# Patient Record
Sex: Male | Born: 1970 | Race: White | Hispanic: Refuse to answer | Marital: Single | Smoking: Former smoker
Health system: Southern US, Community
[De-identification: ages and names within clinical notes are randomized; demographics above are authoritative.]

## PROBLEM LIST (undated history)

## (undated) DIAGNOSIS — R768 Other specified abnormal immunological findings in serum: Secondary | ICD-10-CM

## (undated) DIAGNOSIS — N35919 Unspecified urethral stricture, male, unspecified site: Secondary | ICD-10-CM

## (undated) DIAGNOSIS — F411 Generalized anxiety disorder: Principal | ICD-10-CM

## (undated) DIAGNOSIS — I10 Essential (primary) hypertension: Secondary | ICD-10-CM

## (undated) DIAGNOSIS — N442 Benign cyst of testis: Secondary | ICD-10-CM

## (undated) DIAGNOSIS — M869 Osteomyelitis, unspecified: Secondary | ICD-10-CM

## (undated) HISTORY — DX: Other specified abnormal immunological findings in serum: R76.8

## (undated) HISTORY — DX: Benign cyst of testis: N44.2

## (undated) HISTORY — DX: Osteomyelitis, unspecified: M86.9

## (undated) HISTORY — DX: Generalized anxiety disorder: F41.1

## (undated) HISTORY — DX: Essential (primary) hypertension: I10

## (undated) HISTORY — DX: Unspecified urethral stricture, male, unspecified site: N35.919

---

## 2010-01-14 ENCOUNTER — Emergency Department (HOSPITAL_BASED_OUTPATIENT_CLINIC_OR_DEPARTMENT_OTHER): Admission: EM | Admit: 2010-01-14 | Discharge: 2010-01-14 | Payer: Self-pay | Admitting: Emergency Medicine

## 2010-01-16 ENCOUNTER — Emergency Department (HOSPITAL_BASED_OUTPATIENT_CLINIC_OR_DEPARTMENT_OTHER): Admission: EM | Admit: 2010-01-16 | Discharge: 2010-01-16 | Payer: Self-pay | Admitting: Emergency Medicine

## 2013-01-03 ENCOUNTER — Encounter: Payer: Self-pay | Admitting: Family Medicine

## 2013-01-03 ENCOUNTER — Ambulatory Visit (INDEPENDENT_AMBULATORY_CARE_PROVIDER_SITE_OTHER): Payer: BC Managed Care – PPO | Admitting: Family Medicine

## 2013-01-03 VITALS — BP 134/82 | HR 63 | Ht 77.5 in | Wt 335.0 lb

## 2013-01-03 DIAGNOSIS — Z13220 Encounter for screening for lipoid disorders: Secondary | ICD-10-CM

## 2013-01-03 DIAGNOSIS — N508 Other specified disorders of male genital organs: Secondary | ICD-10-CM

## 2013-01-03 DIAGNOSIS — G5700 Lesion of sciatic nerve, unspecified lower limb: Secondary | ICD-10-CM

## 2013-01-03 DIAGNOSIS — B353 Tinea pedis: Secondary | ICD-10-CM

## 2013-01-03 DIAGNOSIS — G5701 Lesion of sciatic nerve, right lower limb: Secondary | ICD-10-CM

## 2013-01-03 DIAGNOSIS — F411 Generalized anxiety disorder: Secondary | ICD-10-CM | POA: Insufficient documentation

## 2013-01-03 DIAGNOSIS — Z131 Encounter for screening for diabetes mellitus: Secondary | ICD-10-CM

## 2013-01-03 DIAGNOSIS — N442 Benign cyst of testis: Secondary | ICD-10-CM | POA: Insufficient documentation

## 2013-01-03 HISTORY — DX: Benign cyst of testis: N44.2

## 2013-01-03 HISTORY — DX: Generalized anxiety disorder: F41.1

## 2013-01-03 MED ORDER — CLOTRIMAZOLE-BETAMETHASONE 1-0.05 % EX CREA: TOPICAL_CREAM | CUTANEOUS | Status: AC

## 2013-01-03 NOTE — Progress Notes (Signed)
CC: Donovyn Guidice Matzke is a 42 y.o. male is here for Establish Care   Subjective: HPI:  Pleasant 42 year old here to establish care  Complains of left foot discomfort described as somewhat numb and itching in a sock distribution. Mild severity present for months, improves with antifungal/antibacterial/antiviral soap. Nothing particularly makes it worse. Associated redness and flaking of the skin. Denies motor or sensory disturbances in the appendage other than above, denies weakness or joint pain in that foot  Patient complains of right buttock pain is described as a mild burning slight radiation down back of leg. Worse with standing for long periods of time. Improves with movement and stretching. Has been present for a year. Occurs less thanof the week. Significant improvement with ibuprofen. Nothing else makes better or worse. Denies skin changes at the site of discomfort, weakness of the right leg nor sensory disturbances otherwise  Patient reports history of generalized anxiety disorder he is taking Zoloft for over a year with significant improvement. Symptoms are nonexistent while on Zoloft, while off will have panic attacks. Denies depression, mental disturbance otherwise, paranoia, alcohol abuse  Review of Systems - General ROS: negative for - chills, fever, night sweats, weight gain or weight loss Ophthalmic ROS: negative for - decreased vision Psychological ROS: negative for - anxiety or depression ENT ROS: negative for - hearing change, nasal congestion, tinnitus or allergies Hematological and Lymphatic ROS: negative for - bleeding problems, bruising or swollen lymph nodes Breast ROS: negative Respiratory ROS: no cough, shortness of breath, or wheezing Cardiovascular ROS: no chest pain or dyspnea on exertion Gastrointestinal ROS: no abdominal pain, change in bowel habits, or black or bloody stools Genito-Urinary ROS: negative for - genital discharge, genital ulcers, incontinence or  abnormal bleeding from genitals Musculoskeletal ROS: negative for - joint pain or muscle pain Neurological ROS: negative for - headaches or memory loss Dermatological ROS: negative for lumps, mole changes, rash and skin lesion changes  Past Medical History  Diagnosis Date  . Testicular cyst 01/03/2013  . Generalized anxiety disorder 01/03/2013     Family History  Problem Relation Age of Onset  . Adopted: Yes  . Family history unknown: Yes     History  Substance Use Topics  . Smoking status: Former Smoker    Types: Cigarettes    Quit date: 08/16/2012  . Smokeless tobacco: Not on file  . Alcohol Use: 0.5 oz/week    1 drink(s) per week     Objective: Filed Vitals:   01/03/13 1318  BP: 134/82  Pulse: 63    General: Alert and Oriented, No Acute Distress HEENT: Pupils equal, round, reactive to light. Conjunctivae clear.  Moist mucous membranes Lungs: Clear to auscultation bilaterally, no wheezing/ronchi/rales.  Comfortable work of breathing. Good air movement. Cardiac: Regular rate and rhythm. Normal S1/S2.  No murmurs, rubs, nor gallops.   Abdomen: Obese soft nontender Extremities: No peripheral edema.  Strong peripheral pulses.  Diabetic Foot Exam: Dorsalis pedis pulses 1+ bilaterally.  Monofilament sensation intact on plantar and dorsal surface bilaterally.  On the left foot there is mild to moderate erythema with hyperkeratotic skin on the heel reaching to plantar surface near the toes with excoriations . Mental Status: No depression, anxiety, nor agitation. Skin: Warm and dry.  Assessment & Plan: Dawn was seen today for establish care.  Diagnoses and associated orders for this visit:  Generalized anxiety disorder  Lipid screening - Lipid panel  Diabetes mellitus screening - BASIC METABOLIC PANEL WITH GFR  Testicular cyst  Tinea pedis - clotrimazole-betamethasone (LOTRISONE) cream; Apply to affected area twice a day for two weeks, may require four weeks if  involving the feet/toes.  Piriformis syndrome, right    Patient is due for routine screening of dyslipidemia, type 2 diabetes. Testicular cyst is being managed by his urologist  Discuss sensory symptoms in left foot is likely due to tinea pedis therefore start Lotrisone for the next 2 weeks-4 weeks Right buttock pain likely due to piriformis syndrome, handout given with stretches and demonstrated such stretches for him to do a daily basis for the next 2 weeks Generalized anxiety disorder: Controlled, continue Zoloft  Return in about 4 weeks (around 01/31/2013) for cpe.

## 2013-01-04 ENCOUNTER — Telehealth: Payer: Self-pay | Admitting: Family Medicine

## 2013-01-04 DIAGNOSIS — E785 Hyperlipidemia, unspecified: Secondary | ICD-10-CM | POA: Insufficient documentation

## 2013-01-04 DIAGNOSIS — E781 Pure hyperglyceridemia: Secondary | ICD-10-CM | POA: Insufficient documentation

## 2013-01-04 LAB — BASIC METABOLIC PANEL WITH GFR
GFR, Est African American: >89 mL/min
GFR, Est Non African American: >89 mL/min
Potassium: 4.3 mEq/L (ref 3.5–5.3)
Sodium: 139 mEq/L (ref 135–145)

## 2013-01-04 LAB — LIPID PANEL
LDL Cholesterol: 152 mg/dL — ABNORMAL HIGH (ref 0–99)
Total CHOL/HDL Ratio: 7.9 Ratio
VLDL: 61 mg/dL — ABNORMAL HIGH (ref 0–40)

## 2013-01-04 MED ORDER — FENOFIBRATE 48 MG PO TABS: 48.0000 mg | ORAL_TABLET | Freq: Every day | ORAL | Status: DC

## 2013-01-04 NOTE — Telephone Encounter (Signed)
Sue Lush, Will you please let Mr. Corey Mayo know that his LDL cholesterol was at goal however his triglycerides were elevated to a degree that can cause pancreatic and liver inflammation over time.  I'd encourage him to consider starting a Rx medication to help lower this.  I've sent an Rx of tricor[facility]ibrate [facility]  We can check the effectiveness by repeating a cholesterol panel in three months. Triglycerides will improve with weight loss so he's already on the right track.

## 2013-01-04 NOTE — Telephone Encounter (Signed)
Left detailed message on vm with results

## 2013-01-06 ENCOUNTER — Encounter: Payer: Self-pay | Admitting: Family Medicine

## 2013-01-11 ENCOUNTER — Ambulatory Visit (INDEPENDENT_AMBULATORY_CARE_PROVIDER_SITE_OTHER): Payer: BC Managed Care – PPO | Admitting: Family Medicine

## 2013-01-11 ENCOUNTER — Encounter: Payer: Self-pay | Admitting: Family Medicine

## 2013-01-11 VITALS — BP 127/77 | HR 83 | Temp 98.4°F | Resp 18 | Ht 77.5 in | Wt 340.0 lb

## 2013-01-11 DIAGNOSIS — Z0000 Encounter for general adult medical examination without abnormal findings: Secondary | ICD-10-CM

## 2013-01-11 NOTE — Patient Instructions (Addendum)
Dr. Martin Belling's General Advice Following Your Complete Physical Exam  The Benefits of Regular Exercise: Unless you suffer from an uncontrolled cardiovascular condition, studies strongly suggest that regular exercise and physical activity will add to both the quality and length of your life.  The World Health Organization recommends 150 minutes of moderate intensity aerobic activity every week.  This is best split over 3-4 days a week, and can be as simple as a brisk walk for just over 35 minutes "most days of the week".  This type of exercise has been shown to lower LDL-Cholesterol, lower average blood sugars, lower blood pressure, lower cardiovascular disease risk, improve memory, and increase one's overall sense of wellbeing.  The addition of anaerobic (or "strength training") exercises offers additional benefits including but not limited to increased metabolism, prevention of osteoporosis, and improved overall cholesterol levels.  How Can I Strive For A Low-Fat Diet?: Current guidelines recommend that 25-35 percent of your daily energy (food) intake should come from fats.  One might ask how can this be achieved without having to dissect each meal on a daily basis?  Switch to skim or 1% milk instead of whole milk.  Focus on lean meats such as ground turkey, fresh fish, baked chicken, and lean cuts of beef as your source of dietary protein.  Consume less than 300mg/day of dietary cholesterol.  Limit trans fatty acid consumption primarily by limiting synthetic trans fats such as partially hydrogenated oils (Ex: fried fast foods).  Focus efforts on reducing your intake of "solid" fats (Ex: Butter).  Substitute olive or vegetable oil for solid fats where possible.  Moderation of Salt Intake: Provided you don't carry a diagnosis of congestive heart failure nor renal failure, I recommend a daily allowance of no more than 2300 mg of salt (sodium).  Keeping under this daily goal is associated with a  decreased risk of cardiovascular events, creeping above it can lead to elevated blood pressures and increases your risk of cardiovascular events.  Milligrams (mg) of salt is listed on all nutrition labels, and your daily intake can add up faster than you think.  Most canned and frozen dinners can pack in over half your daily salt allowance in one meal.    Lifestyle Health Risks: Certain lifestyle choices carry specific health risks.  As you may already know, tobacco use has been associated with increasing one's risk of cardiovascular disease, pulmonary disease, numerous cancers, among many other issues.  What you may not know is that there are medications and nicotine replacement strategies that can more than double your chances of successfully quitting.  I would be thrilled to help manage your quitting strategy if you currently use tobacco products.  When it comes to alcohol use, I've yet to find an "ideal" daily allowance.  Provided an individual does not have a medical condition that is exacerbated by alcohol consumption, general guidelines determine "safe drinking" as no more than two standard drinks for a man or no more than one standard drink for a male per day.  However, much debate still exists on whether any amount of alcohol consumption is technically "safe".  My general advice, keep alcohol consumption to a minimum for general health promotion.  If you or others believe that alcohol, tobacco, or recreational drug use is interfering with your life, I would be happy to provide confidential counseling regarding treatment options.  General "Over The Counter" Nutrition Advice: Postmenopausal women should aim for a daily calcium intake of 1200 mg, however a significant   portion of this might already be provided by diets including milk, yogurt, cheese, and other dairy products.  Vitamin D has been shown to help preserve bone density, prevent fatigue, and has even been shown to help reduce falls in the  elderly.  Ensuring a daily intake of 800 Units of Vitamin D is a good place to start to enjoy the above benefits, we can easily check your Vitamin D level to see if you'd potentially benefit from supplementation beyond 800 Units a day.  Folic Acid intake should be of particular concern to women of childbearing age.  Daily consumption of 400-800 mcg of Folic Acid is recommended to minimize the chance of spinal cord defects in a fetus should pregnancy occur.    For many adults, accidents still remain one of the most common culprits when it comes to cause of death.  Some of the simplest but most effective preventitive habits you can adopt include regular seatbelt use, proper helmet use, securing firearms, and regularly testing your smoke and carbon monoxide detectors.  Nicolina Hirt B. Megen Madewell DO [facility] [address], [address] [place], [place] [address] Phone: [phone]  

## 2013-01-11 NOTE — Progress Notes (Signed)
CC: Corey Mayo is a 42 y.o. male is here for Annual Exam   Subjective: HPI:  Colonoscopy: No family hx colon cancer, no history of rectal bleeding. Prostate: Discussed screening risks/beneifts with patient on 01/11/2013.    Influenza Vaccine: out of season Pneumovax: no indication, no longer a smoker Td/Tdap: received in 2010 Zoster: Not indicated  Over the past 2 weeks have you been bothered by: - Little interest or pleasure in doing things: no - Feeling down depressed or hopeless: no  No longer smoking tobacco, last use January 2014. Drinks alcohol in moderation, occasional marijuana use no other substance abuse  He has already started making big changes with lowering his saturated fat and overall that fat or consumption, reports his diet was predominantly fast food prior to cholesterol panel earlier this month. He is also trying to increase physical activity on a daily basis  Review of Systems - General ROS: negative for - chills, fever, night sweats, weight gain or weight loss Ophthalmic ROS: negative for - decreased vision Psychological ROS: negative for - anxiety or depression ENT ROS: negative for - hearing change, nasal congestion, tinnitus or allergies Hematological and Lymphatic ROS: negative for - bleeding problems, bruising or swollen lymph nodes Breast ROS: negative Respiratory ROS: no cough, shortness of breath, or wheezing Cardiovascular ROS: no chest pain or dyspnea on exertion Gastrointestinal ROS: no abdominal pain, change in bowel habits, or black or bloody stools Genito-Urinary ROS: negative for - genital discharge, genital ulcers, incontinence or abnormal bleeding from genitals Musculoskeletal ROS: negative for - joint pain or muscle pain Neurological ROS: negative for - headaches or memory loss Dermatological ROS: negative for lumps, mole changes, rash and skin lesion changes   Past Medical History  Diagnosis Date  . Testicular cyst 01/03/2013  .  Generalized anxiety disorder 01/03/2013     Family History  Problem Relation Age of Onset  . Adopted: Yes  . Family history unknown: Yes     History  Substance Use Topics  . Smoking status: Former Smoker    Types: Cigarettes    Quit date: 08/16/2012  . Smokeless tobacco: Not on file  . Alcohol Use: 0.5 oz/week    1 drink(s) per week     Objective: Filed Vitals:   01/11/13 1312  BP: 127/77  Pulse: 83  Temp: 98.4 F (36.9 C)  Resp: 18    General: No Acute Distress HEENT: Atraumatic, normocephalic, conjunctivae normal without scleral icterus.  No nasal discharge, hearing grossly intact, TMs with good landmarks bilaterally with no middle ear abnormalities, posterior pharynx clear without oral lesions. Neck: Supple, trachea midline, no cervical nor supraclavicular adenopathy. Pulmonary: Clear to auscultation bilaterally without wheezing, rhonchi, nor rales. Cardiac: Regular rate and rhythm.  No murmurs, rubs, nor gallops. No peripheral edema.  2+ peripheral pulses bilaterally. Abdomen: Bowel sounds normal.  No masses.  Non-tender without rebound.  Negative Murphy's sign. GU:   Bilateral descended non-tender testicles  without inguinal hernia MSK: Grossly intact, no signs of weakness.  Full strength throughout upper and lower extremities.  Full ROM in upper and lower extremities.  No midline spinal tenderness. Neuro: Gait unremarkable, CN II-XII grossly intact.  C5-C6 Reflex 2/4 Bilaterally, L4 Reflex 2/4 Bilaterally.  Cerebellar function intact. Skin: No rashes. Numerous skin tags under both axilla, seborrheic keratosis on left dorsal wrist, no suspicious lesions. Hidradenitis suppurativa scarring under both left axilla without active disease Psych: Alert and oriented to person/place/time.  Thought process normal. No anxiety/depression.  Assessment &  Plan: Corey Mayo was seen today for annual exam.  Diagnoses and associated orders for this visit:  Physical exam,  annual    Healthy lifestyle interventions including but limited to regular exercise, a healthy low fat diet, moderation of salt intake, the dangers of tobacco/alcohol/recreational drug use, nutrition supplementation, and accident avoidance were discussed with the patient and a handout was provided for future reference.  We spent a good deal of time discussing dietary interventions to help lower his cholesterol and triglycerides. Discussed benefits of exercise, he is going to consider adding over-the-counter once - twice a day fish oil supplement  Return in about 3 months (around 04/13/2013).

## 2013-04-18 ENCOUNTER — Ambulatory Visit (INDEPENDENT_AMBULATORY_CARE_PROVIDER_SITE_OTHER): Payer: BC Managed Care – PPO | Admitting: Family Medicine

## 2013-04-18 ENCOUNTER — Encounter: Payer: Self-pay | Admitting: Family Medicine

## 2013-04-18 VITALS — BP 124/74 | HR 62 | Wt 302.0 lb

## 2013-04-18 DIAGNOSIS — F411 Generalized anxiety disorder: Secondary | ICD-10-CM

## 2013-04-18 DIAGNOSIS — N442 Benign cyst of testis: Secondary | ICD-10-CM

## 2013-04-18 DIAGNOSIS — N508 Other specified disorders of male genital organs: Secondary | ICD-10-CM

## 2013-04-18 DIAGNOSIS — E781 Pure hyperglyceridemia: Secondary | ICD-10-CM

## 2013-04-18 MED ORDER — SERTRALINE HCL 100 MG PO TABS: 100.0000 mg | ORAL_TABLET | Freq: Every day | ORAL | Status: DC

## 2013-04-18 NOTE — Progress Notes (Signed)
CC: Corey Mayo is a 42 y.o. male is here for f/u cholesterol   Subjective: HPI:  Followup hypertriglyceridemia: 3 months ago triglycerides were found to be 300 with moderate elevation of LDL. Patient has significantly cut out simple sugars in his diet now eating more complex carbohydrates and lean meats. He is trying to stay more active but does not have a formal exercise routine. He has been able to lose over 35 pounds over the summer and is feeling great. Denies right upper quadrant pain skin changes chest pain or limb claudication.  Generalized anxiety disorder: He has been on Zoloft for the past 10 years and states he's feeling that this is considerably helped him as he no longer has any anxiety. 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 He would like to know if I be able to now take over the formal prescription. Denies thoughts wanting to harm himself or others   Review Of Systems Outlined In HPI  Past Medical History  Diagnosis Date  . Testicular cyst 01/03/2013  . Generalized anxiety disorder 01/03/2013     Family History  Problem Relation Age of Onset  . Adopted: Yes     History  Substance Use Topics  . Smoking status: Former Smoker    Types: Cigarettes    Quit date: 08/16/2012  . Smokeless tobacco: Not on file  . Alcohol Use: 0.5 oz/week    1 drink(s) per week     Objective: Filed Vitals:   04/18/13 1312  BP: 124/74  Pulse: 62    Vital signs reviewed. General: Alert and Oriented, No Acute Distress HEENT: Pupils equal, round, reactive to light. Conjunctivae clear.  External ears unremarkable.  Moist mucous membranes. Lungs: Clear and comfortable work of breathing, speaking in full sentences without accessory muscle use. Cardiac: Regular rate and rhythm.  Neuro: CN II-XII grossly intact, gait normal. Extremities: No peripheral edema.  Strong peripheral pulses.  Mental Status: No depression, anxiety, nor agitation.  Logical though process. Skin: Warm and dry.  Assessment & Plan: Cardin was seen today for f/u cholesterol.  Diagnoses and associated orders for this visit:  Hypertriglyceridemia - Lipid panel  Testicular cyst  Generalized anxiety disorder  Other Orders - sertraline (ZOLOFT) 100 MG tablet; Take 1 tablet (100 mg total) by mouth daily.    Hypertriglyceridemia: Repeat fasting lipid panel, congratulated him on drastic weight loss, he would really like to avoid medication if possible Generalized anxiety disorder: Stable continue Zoloft   Return if symptoms worsen or fail to improve.

## 2013-04-19 LAB — LIPID PANEL
Cholesterol: 170 mg/dL (ref 0–200)
HDL: 34 mg/dL — ABNORMAL LOW (ref >39)
Triglycerides: 105 mg/dL (ref <150)

## 2013-05-26 ENCOUNTER — Other Ambulatory Visit: Payer: Self-pay

## 2014-01-23 ENCOUNTER — Encounter: Payer: Self-pay | Admitting: Family Medicine

## 2014-01-23 ENCOUNTER — Ambulatory Visit (INDEPENDENT_AMBULATORY_CARE_PROVIDER_SITE_OTHER): Payer: BC Managed Care – PPO | Admitting: Family Medicine

## 2014-01-23 VITALS — BP 131/83 | HR 55 | Ht 77.5 in | Wt 275.0 lb

## 2014-01-23 DIAGNOSIS — B353 Tinea pedis: Secondary | ICD-10-CM

## 2014-01-23 DIAGNOSIS — Z131 Encounter for screening for diabetes mellitus: Secondary | ICD-10-CM

## 2014-01-23 DIAGNOSIS — E785 Hyperlipidemia, unspecified: Secondary | ICD-10-CM

## 2014-01-23 DIAGNOSIS — Z0000 Encounter for general adult medical examination without abnormal findings: Secondary | ICD-10-CM

## 2014-01-23 DIAGNOSIS — E781 Pure hyperglyceridemia: Secondary | ICD-10-CM

## 2014-01-23 DIAGNOSIS — B351 Tinea unguium: Secondary | ICD-10-CM

## 2014-01-23 MED ORDER — TERBINAFINE HCL 250 MG PO TABS: 250.0000 mg | ORAL_TABLET | Freq: Every day | ORAL | Status: DC

## 2014-01-23 MED ORDER — CLOTRIMAZOLE-BETAMETHASONE 1-0.05 % EX CREA: TOPICAL_CREAM | CUTANEOUS | Status: AC

## 2014-01-23 NOTE — Patient Instructions (Signed)
Dr. Zaquan Duffner's General Advice Following Your Complete Physical Exam  The Benefits of Regular Exercise: Unless you suffer from an uncontrolled cardiovascular condition, studies strongly suggest that regular exercise and physical activity will add to both the quality and length of your life.  The World Health Organization recommends 150 minutes of moderate intensity aerobic activity every week.  This is best split over 3-4 days a week, and can be as simple as a brisk walk for just over 35 minutes "most days of the week".  This type of exercise has been shown to lower LDL-Cholesterol, lower average blood sugars, lower blood pressure, lower cardiovascular disease risk, improve memory, and increase one's overall sense of wellbeing.  The addition of anaerobic (or "strength training") exercises offers additional benefits including but not limited to increased metabolism, prevention of osteoporosis, and improved overall cholesterol levels.  How Can I Strive For A Low-Fat Diet?: Current guidelines recommend that 25-35 percent of your daily energy (food) intake should come from fats.  One might ask how can this be achieved without having to dissect each meal on a daily basis?  Switch to skim or 1% milk instead of whole milk.  Focus on lean meats such as ground turkey, fresh fish, baked chicken, and lean cuts of beef as your source of dietary protein.  Limit saturated fat consumption to less than 10% of your daily caloric intake.  Limit trans fatty acid consumption primarily by limiting synthetic trans fats such as partially hydrogenated oils (Ex: fried fast foods).  Substitute olive or vegetable oil for solid fats where possible.  Moderation of Salt Intake: Provided you don't carry a diagnosis of congestive heart failure nor renal failure, I recommend a daily allowance of no more than 2300 mg of salt (sodium).  Keeping under this daily goal is associated with a decreased risk of cardiovascular events, creeping  above it can lead to elevated blood pressures and increases your risk of cardiovascular events.  Milligrams (mg) of salt is listed on all nutrition labels, and your daily intake can add up faster than you think.  Most canned and frozen dinners can pack in over half your daily salt allowance in one meal.    Lifestyle Health Risks: Certain lifestyle choices carry specific health risks.  As you may already know, tobacco use has been associated with increasing one's risk of cardiovascular disease, pulmonary disease, numerous cancers, among many other issues.  What you may not know is that there are medications and nicotine replacement strategies that can more than double your chances of successfully quitting.  I would be thrilled to help manage your quitting strategy if you currently use tobacco products.  When it comes to alcohol use, I've yet to find an "ideal" daily allowance.  Provided an individual does not have a medical condition that is exacerbated by alcohol consumption, general guidelines determine "safe drinking" as no more than two standard drinks for a man or no more than one standard drink for a male per day.  However, much debate still exists on whether any amount of alcohol consumption is technically "safe".  My general advice, keep alcohol consumption to a minimum for general health promotion.  If you or others believe that alcohol, tobacco, or recreational drug use is interfering with your life, I would be happy to provide confidential counseling regarding treatment options.  General "Over The Counter" Nutrition Advice: Postmenopausal women should aim for a daily calcium intake of 1200 mg, however a significant portion of this might already be   provided by diets including milk, yogurt, cheese, and other dairy products.  Vitamin D has been shown to help preserve bone density, prevent fatigue, and has even been shown to help reduce falls in the elderly.  Ensuring a daily intake of 800 Units of  Vitamin D is a good place to start to enjoy the above benefits, we can easily check your Vitamin D level to see if you'd potentially benefit from supplementation beyond 800 Units a day.  Folic Acid intake should be of particular concern to women of childbearing age.  Daily consumption of 400-800 mcg of Folic Acid is recommended to minimize the chance of spinal cord defects in a fetus should pregnancy occur.    For many adults, accidents still remain one of the most common culprits when it comes to cause of death.  Some of the simplest but most effective preventitive habits you can adopt include regular seatbelt use, proper helmet use, securing firearms, and regularly testing your smoke and carbon monoxide detectors.  Darthula Desa B. Marsia Cino DO [facility] [address], [address] [place], [place] [address] Phone: [phone]  Testicular Self-Exam A self-examination of your testicles involves looking at and feeling your testicles for abnormal lumps or swelling. Several things can cause swelling, lumps, or pain in your testicles. Some of these causes are:  Injuries.  Inflammation.  Infection.  Accumulation of fluids around your testicle (hydrocele).  Twisted testicles (testicular torsion).  Testicular cancer. Self-examination of the testicles and groin areas may be advised if you are at risk for testicular cancer. Risks for testicular cancer include:  An undescended testicle (cryptorchidism).  A history of previous testicular cancer.  A family history of testicular cancer. The testicles are easiest to examine after warm baths or showers and are more difficult to examine when you are cold. This is because the muscles attached to the testicles retract and pull them up higher or into the abdomen. Follow these steps while you are standing:  Hold your penis away from your body.  Roll one testicle between your thumb and forefinger, feeling the entire testicle.  Roll the other  testicle between your thumb and forefinger, feeling the entire testicle. Feel for lumps, swelling, or discomfort. A normal testicle is egg shaped and feels firm. It is smooth and not tender. The spermatic cord can be felt as a firm spaghetti-like cord at the back of your testicle. It is also important to examine the crease between the front of your leg and your abdomen. Feel for any bumps that are tender. These could be enlarged lymph nodes.  Document Released: 10/13/2000 Document Revised: 03/09/2013 Document Reviewed: 12/27/2012 [employer] Patient Information 2015 [employer]. This information is not intended to replace advice given to you by your health care provider. Make sure you discuss any questions you have with your health care provider.  

## 2014-01-23 NOTE — Progress Notes (Signed)
CC: Corey Mayo is a 43 y.o. male is here for Annual Exam   Subjective: HPI:  Colonoscopy: No family history of colon cancer will begin screening at age 43 Prostate: Discussed screening risks/beneifts with patient during today's visit, no family history of prostate cancer we'll consider screening start at age 43  Influenza Vaccine: No current indication but I've recommended that he get this during flu season Pneumovax: No current indication Td/Tdap: Tdap 2010 up-to-date Zoster: (Start 43 yo)  No tobacco, alcohol or recreational drug use.  His only complaint is discoloration of the feet and toenails.  He continues to exercise most days of the week with a bicycle or stationary trainer.  Review of Systems - General ROS: negative for - chills, fever, night sweats, weight gain or weight loss Ophthalmic ROS: negative for - decreased vision Psychological ROS: negative for - anxiety or depression ENT ROS: negative for - hearing change, nasal congestion, tinnitus or allergies Hematological and Lymphatic ROS: negative for - bleeding problems, bruising or swollen lymph nodes Breast ROS: negative Respiratory ROS: no cough, shortness of breath, or wheezing Cardiovascular ROS: no chest pain or dyspnea on exertion Gastrointestinal ROS: no abdominal pain, change in bowel habits, or black or bloody stools Genito-Urinary ROS: negative for - genital discharge, genital ulcers, incontinence or abnormal bleeding from genitals Musculoskeletal ROS: negative for - joint pain or muscle pain Neurological ROS: negative for - headaches or memory loss Dermatological ROS: negative for lumps, mole changes, rash and skin lesion changes other than flaking of both feet and no discoloration of the nails  Past Medical History  Diagnosis Date  . Testicular cyst 01/03/2013  . Generalized anxiety disorder 01/03/2013    No past surgical history on file. Family History  Problem Relation Age of Onset  . Adopted:  Yes    History   Social History  . Marital Status: Married    Spouse Name: N/A    Number of Children: N/A  . Years of Education: N/A   Occupational History  . Not on file.   Social History Main Topics  . Smoking status: Former Smoker    Types: Cigarettes    Quit date: 08/16/2012  . Smokeless tobacco: Not on file  . Alcohol Use: 0.5 oz/week    1 drink(s) per week  . Drug Use: Yes  . Sexual Activity: Yes   Other Topics Concern  . Not on file   Social History Narrative  . No narrative on file     Objective: BP 131/83  Pulse 55  Ht 6' 5.5" (1.969 m)  Wt 275 lb (124.739 kg)  BMI 32.17 kg/m2  General: No Acute Distress HEENT: Atraumatic, normocephalic, conjunctivae normal without scleral icterus.  No nasal discharge, hearing grossly intact, TMs with good landmarks bilaterally with no middle ear abnormalities, posterior pharynx clear without oral lesions. Neck: Supple, trachea midline, no cervical nor supraclavicular adenopathy. Pulmonary: Clear to auscultation bilaterally without wheezing, rhonchi, nor rales. Cardiac: Regular rate and rhythm.  No murmurs, rubs, nor gallops. No peripheral edema.  2+ peripheral pulses bilaterally. Abdomen: Bowel sounds normal.  No masses.  Non-tender without rebound.  Negative Murphy's sign. GU: Bilateral descended testes without inguinal hernia MSK: Grossly intact, no signs of weakness.  Full strength throughout upper and lower extremities.  Full ROM in upper and lower extremities.  No midline spinal tenderness. Neuro: Gait unremarkable, CN II-XII grossly intact.  C5-C6 Reflex 2/4 Bilaterally, L4 Reflex 2/4 Bilaterally.  Cerebellar function intact. Skin: No rashes. Flaking and  cracking of both bilateral feet most predominant in the heels with mild erythema and yellowing and flaking of all 10 toenails Psych: Alert and oriented to person/place/time.  Thought process normal. No anxiety/depression.  Assessment & Plan: Corey Mayo was seen today for  annual exam.  Diagnoses and associated orders for this visit:  Annual physical exam  Hypertriglyceridemia - Lipid panel - COMPLETE METABOLIC PANEL WITH GFR  Hyperlipidemia LDL goal <160 - Lipid panel  Diabetes mellitus screening - COMPLETE METABOLIC PANEL WITH GFR  Nail fungus - terbinafine (LAMISIL) 250 MG tablet; Take 1 tablet (250 mg total) by mouth daily.  Tinea pedis of both feet - clotrimazole-betamethasone (LOTRISONE) cream; Apply to affected area twice a day for two weeks, may require four weeks if involving the feet/toes.    Onychomycosis: Start terbinafine, checking liver enzymes today, recheck liver enzymes as well as lab only visit in one month. Healthy lifestyle interventions including but not limited to regular exercise, a healthy low fat diet, moderation of salt intake, the dangers of tobacco/alcohol/recreational drug use, nutrition supplementation, and accident avoidance were discussed with the patient and a handout was provided for future reference.    Return in about 1 year (around 01/24/2015) for annual physical.

## 2014-01-24 LAB — COMPLETE METABOLIC PANEL WITH GFR
ALT: 12 U/L (ref 0–53)
AST: 14 U/L (ref 0–37)
Albumin: 3.9 g/dL (ref 3.5–5.2)
Alkaline Phosphatase: 64 U/L (ref 39–117)
BUN: 8 mg/dL (ref 6–23)
CALCIUM: 8.8 mg/dL (ref 8.4–10.5)
CHLORIDE: 108 meq/L (ref 96–112)
CO2: 24 meq/L (ref 19–32)
CREATININE: 0.66 mg/dL (ref 0.50–1.35)
GLUCOSE: 79 mg/dL (ref 70–99)
Potassium: 4.3 mEq/L (ref 3.5–5.3)
Sodium: 141 mEq/L (ref 135–145)
Total Bilirubin: 0.6 mg/dL (ref 0.2–1.2)
Total Protein: 6.4 g/dL (ref 6.0–8.3)

## 2014-01-24 LAB — LIPID PANEL
CHOLESTEROL: 170 mg/dL (ref 0–200)
HDL: 41 mg/dL (ref >39)
LDL CALC: 101 mg/dL — AB (ref 0–99)
TRIGLYCERIDES: 142 mg/dL (ref <150)
Total CHOL/HDL Ratio: 4.1 Ratio
VLDL: 28 mg/dL (ref 0–40)

## 2014-04-21 ENCOUNTER — Telehealth: Payer: Self-pay | Admitting: *Deleted

## 2014-04-21 ENCOUNTER — Other Ambulatory Visit: Payer: Self-pay | Admitting: Family Medicine

## 2014-04-21 NOTE — Telephone Encounter (Signed)
Pt called requesting refill on sertraline. It was already sent to the pharm but pt does need a f/u since it has been a year since he was last seen specifically for anxiety. Pt notified and he will schedule an appt

## 2016-02-06 ENCOUNTER — Encounter: Payer: Self-pay | Admitting: Family Medicine

## 2016-02-06 ENCOUNTER — Ambulatory Visit (INDEPENDENT_AMBULATORY_CARE_PROVIDER_SITE_OTHER): Payer: BLUE CROSS/BLUE SHIELD | Admitting: Family Medicine

## 2016-02-06 VITALS — BP 135/82 | HR 61 | Wt 239.0 lb

## 2016-02-06 DIAGNOSIS — Z202 Contact with and (suspected) exposure to infections with a predominantly sexual mode of transmission: Secondary | ICD-10-CM | POA: Diagnosis not present

## 2016-02-06 DIAGNOSIS — Z0000 Encounter for general adult medical examination without abnormal findings: Secondary | ICD-10-CM

## 2016-02-06 LAB — CBC
HEMATOCRIT: 40.5 % (ref 38.5–50.0)
Hemoglobin: 13.7 g/dL (ref 13.2–17.1)
MCH: 29.3 pg (ref 27.0–33.0)
MCHC: 33.8 g/dL (ref 32.0–36.0)
MCV: 86.7 fL (ref 80.0–100.0)
MPV: 11 fL (ref 7.5–12.5)
Platelets: 293 10*3/uL (ref 140–400)
RBC: 4.67 MIL/uL (ref 4.20–5.80)
RDW: 14 % (ref 11.0–15.0)
WBC: 6.4 10*3/uL (ref 3.8–10.8)

## 2016-02-06 MED ORDER — SERTRALINE HCL 100 MG PO TABS: 100.0000 mg | ORAL_TABLET | Freq: Every day | ORAL | Status: DC

## 2016-02-06 NOTE — Progress Notes (Signed)
CC: Corey Mayo is a 45 y.o. male is here for Annual Exam   Subjective: HPI:  No new family history of colon cancer or prostate cancer discussed screening at age 45.  Up-to-date on all immunizations.  No complaints today requesting a complete physical exam. One of his male sexual partners contacted him that she came down with HSV-2, Jorja Loaim has been asymptomatic but would like to be tested for this along with other common STDs.  Review of Systems - General ROS: negative for - chills, fever, night sweats, weight gain or weight loss Ophthalmic ROS: negative for - decreased vision Psychological ROS: negative for - anxiety or depression ENT ROS: negative for - hearing change, nasal congestion, tinnitus or allergies Hematological and Lymphatic ROS: negative for - bleeding problems, bruising or swollen lymph nodes Breast ROS: negative Respiratory ROS: no cough, shortness of breath, or wheezing Cardiovascular ROS: no chest pain or dyspnea on exertion Gastrointestinal ROS: no abdominal pain, change in bowel habits, or black or bloody stools Genito-Urinary ROS: negative for - genital discharge, genital ulcers, incontinence or abnormal bleeding from genitals Musculoskeletal ROS: negative for - joint pain or muscle pain Neurological ROS: negative for - headaches or memory loss Dermatological ROS: negative for lumps, mole changes, rash and skin lesion changes  Past Medical History  Diagnosis Date  . Testicular cyst 01/03/2013  . Generalized anxiety disorder 01/03/2013    No past surgical history on file. Family History  Problem Relation Age of Onset  . Adopted: Yes    Social History   Social History  . Marital Status: Married    Spouse Name: N/A  . Number of Children: N/A  . Years of Education: N/A   Occupational History  . Not on file.   Social History Main Topics  . Smoking status: Former Smoker    Types: Cigarettes    Quit date: 08/16/2012  . Smokeless tobacco: Not on file   . Alcohol Use: 0.5 oz/week    1 drink(s) per week  . Drug Use: Yes  . Sexual Activity: Yes   Other Topics Concern  . Not on file   Social History Narrative  . No narrative on file     Objective: BP 135/82 mmHg  Pulse 61  Wt 239 lb (108.41 kg)  General: No Acute Distress HEENT: Atraumatic, normocephalic, conjunctivae normal without scleral icterus.  No nasal discharge, hearing grossly intact, TMs with good landmarks bilaterally with no middle ear abnormalities, posterior pharynx clear without oral lesions. Neck: Supple, trachea midline, no cervical nor supraclavicular adenopathy. Pulmonary: Clear to auscultation bilaterally without wheezing, rhonchi, nor rales. Cardiac: Regular rate and rhythm.  No murmurs, rubs, nor gallops. No peripheral edema.  2+ peripheral pulses bilaterally. Abdomen: Bowel sounds normal.  No masses.  Non-tender without rebound.  Negative Murphy's sign. MSK: Grossly intact, no signs of weakness.  Full strength throughout upper and lower extremities.  Full ROM in upper and lower extremities.  No midline spinal tenderness. Neuro: Gait unremarkable, CN II-XII grossly intact.  C5-C6 Reflex 2/4 Bilaterally, L4 Reflex 2/4 Bilaterally.  Cerebellar function intact. Skin: No rashes. Psych: Alert and oriented to person/place/time.  Thought process normal. No anxiety/depression. Assessment & Plan: Corey Mayo was seen today for annual exam.  Diagnoses and all orders for this visit:  Annual physical exam -     Lipid panel -     COMPLETE METABOLIC PANEL WITH GFR -     CBC  STD exposure -     HSV 2 antibody,  IgG -     HSV 1 antibody, IgG -     HIV antibody -     RPR -     Hepatitis C antibody  Other orders -     sertraline (ZOLOFT) 100 MG tablet; Take 1 tablet (100 mg total) by mouth daily.   Healthy lifestyle interventions including but not limited to regular exercise, a healthy low fat diet, moderation of salt intake, the dangers of tobacco/alcohol/recreational  drug use, nutrition supplementation, and accident avoidance were discussed with the patient and a handout was provided for future reference.  Discussed with this patient that I will be resigning from my position here with [facility] in September in order to stay with my family who will be moving to [facility]. I let him know about the providers that are still accepting patients and I feel that this individual will be under great care if he/she stays here with [facility].  Return in about 1 year (around 02/05/2017) for Annual physical.

## 2016-02-07 ENCOUNTER — Telehealth: Payer: Self-pay | Admitting: Family Medicine

## 2016-02-07 DIAGNOSIS — R7689 Other specified abnormal immunological findings in serum: Secondary | ICD-10-CM | POA: Insufficient documentation

## 2016-02-07 DIAGNOSIS — R768 Other specified abnormal immunological findings in serum: Secondary | ICD-10-CM | POA: Insufficient documentation

## 2016-02-07 HISTORY — DX: Other specified abnormal immunological findings in serum: R76.8

## 2016-02-07 LAB — LIPID PANEL
CHOL/HDL RATIO: 3.2 ratio (ref <=5.0)
CHOLESTEROL: 163 mg/dL (ref 125–200)
HDL: 51 mg/dL (ref >=40)
LDL CALC: 95 mg/dL (ref <130)
TRIGLYCERIDES: 83 mg/dL (ref <150)
VLDL: 17 mg/dL (ref <30)

## 2016-02-07 LAB — RPR

## 2016-02-07 LAB — COMPLETE METABOLIC PANEL WITH GFR
ALT: 15 U/L (ref 9–46)
AST: 16 U/L (ref 10–40)
Albumin: 4 g/dL (ref 3.6–5.1)
Alkaline Phosphatase: 50 U/L (ref 40–115)
BUN: 8 mg/dL (ref 7–25)
CALCIUM: 8.6 mg/dL (ref 8.6–10.3)
CHLORIDE: 108 mmol/L (ref 98–110)
CO2: 21 mmol/L (ref 20–31)
CREATININE: 0.72 mg/dL (ref 0.60–1.35)
GFR, Est African American: >89 mL/min (ref >=60)
Glucose, Bld: 87 mg/dL (ref 65–99)
POTASSIUM: 3.9 mmol/L (ref 3.5–5.3)
Sodium: 140 mmol/L (ref 135–146)
Total Bilirubin: 0.8 mg/dL (ref 0.2–1.2)
Total Protein: 6.7 g/dL (ref 6.1–8.1)

## 2016-02-07 LAB — HEPATITIS C ANTIBODY: HCV Ab: NEGATIVE

## 2016-02-07 LAB — HIV ANTIBODY (ROUTINE TESTING W REFLEX): HIV: NONREACTIVE

## 2016-02-07 LAB — HSV 1 ANTIBODY, IGG: HSV 1 GLYCOPROTEIN G AB, IGG: 21.3 {index} — AB (ref <0.90)

## 2016-02-07 LAB — HSV 2 ANTIBODY, IGG: HSV 2 Glycoprotein G Ab, IgG: 3.85 Index — ABNORMAL HIGH (ref <0.90)

## 2016-02-07 MED ORDER — VALACYCLOVIR HCL 1 G PO TABS: 1000.0000 mg | ORAL_TABLET | Freq: Two times a day (BID) | ORAL | Status: DC

## 2016-02-07 NOTE — Telephone Encounter (Signed)
Pt notified of results and recommendations.

## 2016-02-07 NOTE — Telephone Encounter (Signed)
We please let patient know that his cholesterol, kidney function, liver function, blood cell counts, and blood sugar were normal. 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 This puts him at risk of having an outbreak in the future, if he ever notices that he is developing a tender sore in the genital region or around his lips I recommend he take a prescription of Valtrex as soon as possible. I [facility] of [facility] for him to have on hand just in case something happens in the future.

## 2016-07-13 ENCOUNTER — Emergency Department (HOSPITAL_BASED_OUTPATIENT_CLINIC_OR_DEPARTMENT_OTHER)
Admission: EM | Admit: 2016-07-13 | Discharge: 2016-07-13 | Disposition: A | Discharge status: Home / Self Care | Payer: BLUE CROSS/BLUE SHIELD | Attending: Emergency Medicine | Admitting: Emergency Medicine

## 2016-07-13 ENCOUNTER — Encounter (HOSPITAL_BASED_OUTPATIENT_CLINIC_OR_DEPARTMENT_OTHER): Payer: Self-pay | Admitting: *Deleted

## 2016-07-13 DIAGNOSIS — Z23 Encounter for immunization: Secondary | ICD-10-CM | POA: Insufficient documentation

## 2016-07-13 DIAGNOSIS — Y939 Activity, unspecified: Secondary | ICD-10-CM | POA: Diagnosis not present

## 2016-07-13 DIAGNOSIS — S61212A Laceration without foreign body of right middle finger without damage to nail, initial encounter: Secondary | ICD-10-CM | POA: Diagnosis present

## 2016-07-13 DIAGNOSIS — Y999 Unspecified external cause status: Secondary | ICD-10-CM | POA: Diagnosis not present

## 2016-07-13 DIAGNOSIS — Z87891 Personal history of nicotine dependence: Secondary | ICD-10-CM | POA: Diagnosis not present

## 2016-07-13 DIAGNOSIS — Y929 Unspecified place or not applicable: Secondary | ICD-10-CM | POA: Insufficient documentation

## 2016-07-13 DIAGNOSIS — W268XXA Contact with other sharp object(s), not elsewhere classified, initial encounter: Secondary | ICD-10-CM | POA: Diagnosis not present

## 2016-07-13 MED ORDER — TETANUS-DIPHTH-ACELL PERTUSSIS 5-2.5-18.5 LF-MCG/0.5 IM SUSP
0.5000 mL | Freq: Once | INTRAMUSCULAR | Status: AC
  Administered 2016-07-13: 0.5 mL via INTRAMUSCULAR
  Filled 2016-07-13: qty 0.5

## 2016-07-13 NOTE — ED Notes (Signed)
Bacitracin and bandaid applied to repaired lac.

## 2016-07-13 NOTE — ED Provider Notes (Signed)
[facility] Note   CSN: [id number] Arrival date & time: 07/13/16  1959 By signing my name below, I, Levon HedgerElizabeth Hall, attest that this documentation has been prepared under the direction and in the presence of Nira ConnPedro Eduardo Andilyn Bettcher, MD . Electronically Signed: Levon HedgerElizabeth Hall, Scribe. 07/13/2016. 9:36 PM.   History   Chief Complaint Chief Complaint  Patient presents with  . Laceration    HPI Corey Mayo is a 45 y.o. male who presents to the Emergency Department complaining of a laceration to his right middle finger sustained tonight at 5:30 pm. Pt states that he cut his right middle finger on a metal tab from a picture frame. The metal from the picture frame remained intact. Bleeding is controlled. No treatments tried PTA. He is unsure of his tetanus shot. No hx of DM. Pt is a nonsmoker. Pt denies any pain at this time.  The history is provided by the patient. No language interpreter was used.    Past Medical History:  Diagnosis Date  . Generalized anxiety disorder 01/03/2013  . Testicular cyst 01/03/2013    Patient Active Problem List   Diagnosis Date Noted  . 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 Hyperlipidemia LDL goal <160 01/04/2013  . Hypertriglyceridemia 01/04/2013  . Testicular cyst 01/03/2013  . Piriformis syndrome 01/03/2013  . Generalized anxiety disorder 01/03/2013   History reviewed. No pertinent surgical history.   Home Medications    Prior to Admission medications   Medication Sig Start Date End Date Taking? Authorizing Provider  sertraline (ZOLOFT) 100 MG tablet Take 1 tablet (100 mg total) by mouth daily. 02/06/16   Sean Hommel, DO  valACYclovir (VALTREX) 1000 MG tablet Take 1 tablet (1,000 mg total) by mouth 2 (two) times daily. For seven days, start ASAP if outbreak occurs. 02/07/16   Laren BoomSean Hommel, DO    Family History Family History  Problem Relation Age of Onset  . Adopted: Yes    Social History Social History  Substance Use  Topics  . Smoking status: Former Smoker    Types: Cigarettes    Quit date: 08/16/2012  . Smokeless tobacco: Never Used  . Alcohol use 0.5 oz/week    1 Standard drinks or equivalent per week     Allergies   Patient has no known allergies.   Review of Systems Review of Systems 10 systems reviewed and all are negative for acute change except as noted in the HPI.   Physical Exam Updated Vital Signs BP 146/86   Pulse 76   Temp 98.2 F (36.8 C) (Oral)   Resp 16   Ht 6' 5.5" (1.969 m)   Wt 239 lb (108.4 kg)   SpO2 98%   BMI 27.98 kg/m   Physical Exam  Constitutional: He is oriented to person, place, and time. He appears well-developed and well-nourished. No distress.  HENT:  Head: Normocephalic and atraumatic.  Right Ear: External ear normal.  Left Ear: External ear normal.  Nose: Nose normal.  Mouth/Throat: Mucous membranes are normal. No trismus in the jaw.  Eyes: Conjunctivae and EOM are normal. No scleral icterus.  Neck: Normal range of motion and phonation normal.  Cardiovascular: Normal rate and regular rhythm.   Pulmonary/Chest: Effort normal. No stridor. No respiratory distress.  Abdominal: He exhibits no distension.  Musculoskeletal: Normal range of motion. He exhibits no edema.  Neurological: He is alert and oriented to person, place, and time.  Skin: He is not diaphoretic.  1.5 cm superficial laceration to the right middle  finger involving the dermis. No foreign bodies noted. No erythema or discharge.   Psychiatric: He has a normal mood and affect. His behavior is normal.  Vitals reviewed.    ED Treatments / Results  DIAGNOSTIC STUDIES:  Oxygen Saturation is 98% on RA, normal by my interpretation.    COORDINATION OF CARE:  9:36 PM Discussed treatment plan with pt which includes sutures at bedside and pt agreed to plan.  Labs (all labs ordered are listed, but only abnormal results are displayed) Labs Reviewed - No data to display  EKG  EKG  Interpretation None       Radiology No results found.  Procedures .Marland Kitchen.Laceration Repair Date/Time: 07/13/2016 10:27 PM Performed by: Nira ConnARDAMA, Branna Cortina EDUARDO Authorized by: Nira ConnARDAMA, Augustina Braddock EDUARDO   Consent:    Consent obtained:  Verbal   Consent given by:  Patient   Risks discussed:  Infection, pain and poor cosmetic result   Alternatives discussed:  No treatment Anesthesia (see MAR for exact dosages):    Anesthesia method:  None (per pt request) Laceration details:    Location:  Finger   Finger location:  R long finger   Length (cm):  1.5   Depth (mm):  2 Pre-procedure details:    Preparation:  Patient was prepped and draped in usual sterile fashion Exploration:    Wound exploration: wound explored through full range of motion and entire depth of wound probed and visualized     Wound extent: no fascia violation noted, no foreign bodies/material noted, no muscle damage noted, no nerve damage noted, no tendon damage noted and no vascular damage noted     Contaminated: no   Treatment:    Area cleansed with:  Betadine   Amount of cleaning:  Extensive   Irrigation solution:  Sterile saline   Irrigation volume:  250   Irrigation method:  Syringe   Visualized foreign bodies/material removed: no   Skin repair:    Repair method:  Sutures   Suture size:  4-0   Wound skin closure material used: ethilon.   Suture technique:  Simple interrupted   Number of sutures:  1 Approximation:    Approximation:  Loose   Vermilion border: well-aligned   Post-procedure details:    Patient tolerance of procedure:  Tolerated well, no immediate complications    (including critical care time)  Medications Ordered in ED Medications  Tdap (BOOSTRIX) injection 0.5 mL (0.5 mLs Intramuscular Given 07/13/16 2247)     Initial Impression / Assessment and Plan / ED Course  I have reviewed the triage vital signs and the nursing notes.  Pertinent labs & imaging results that were available during  my care of the patient were reviewed by me and considered in my medical decision making (see chart for details).  Clinical Course     Superficial right middle finger pad laceration without visualized foreign body and low suspicion of one. Thoroughly irrigated. Closed as above. Tetanus updated.  The patient is safe for discharge with strict return precautions.   Final Clinical Impressions(s) / ED Diagnoses   Final diagnoses:  Laceration of right middle finger without foreign body without damage to nail, initial encounter   Disposition: Discharge  Condition: Good  I have discussed the results, Dx and Tx plan with the patient who expressed understanding and agree(s) with the plan. Discharge instructions discussed at great length. The patient was given strict return precautions who verbalized understanding of the instructions. No further questions at time of discharge.    Discharge  Medication List as of 07/13/2016 11:10 PM      Follow Up: Laren Boom, DO  Schedule an appointment as soon as possible for a visit in 10 days For suture removal   I personally performed the services described in this documentation, which was scribed in my presence. The recorded information has been reviewed and is accurate.        Nira Conn, MD 07/14/16 [phone]

## 2016-07-13 NOTE — ED Triage Notes (Signed)
Laceration to his right middle finger on a piece of metal while putting a toy together for his daughters Christmas gift.

## 2016-10-16 ENCOUNTER — Emergency Department (INDEPENDENT_AMBULATORY_CARE_PROVIDER_SITE_OTHER): Payer: BLUE CROSS/BLUE SHIELD

## 2016-10-16 ENCOUNTER — Encounter: Payer: Self-pay | Admitting: Emergency Medicine

## 2016-10-16 ENCOUNTER — Ambulatory Visit (INDEPENDENT_AMBULATORY_CARE_PROVIDER_SITE_OTHER): Payer: BLUE CROSS/BLUE SHIELD | Admitting: Family Medicine

## 2016-10-16 ENCOUNTER — Emergency Department (INDEPENDENT_AMBULATORY_CARE_PROVIDER_SITE_OTHER)
Admission: EM | Admit: 2016-10-16 | Discharge: 2016-10-16 | Disposition: A | Discharge status: Home / Self Care | Payer: BLUE CROSS/BLUE SHIELD | Source: Home / Self Care | Attending: Family Medicine | Admitting: Family Medicine

## 2016-10-16 DIAGNOSIS — S92512B Displaced fracture of proximal phalanx of left lesser toe(s), initial encounter for open fracture: Secondary | ICD-10-CM

## 2016-10-16 DIAGNOSIS — S92502B Displaced unspecified fracture of left lesser toe(s), initial encounter for open fracture: Secondary | ICD-10-CM | POA: Diagnosis not present

## 2016-10-16 DIAGNOSIS — W228XXA Striking against or struck by other objects, initial encounter: Secondary | ICD-10-CM | POA: Diagnosis not present

## 2016-10-16 DIAGNOSIS — L089 Local infection of the skin and subcutaneous tissue, unspecified: Secondary | ICD-10-CM | POA: Diagnosis not present

## 2016-10-16 DIAGNOSIS — S92512A Displaced fracture of proximal phalanx of left lesser toe(s), initial encounter for closed fracture: Secondary | ICD-10-CM

## 2016-10-16 DIAGNOSIS — S91139A Puncture wound without foreign body of unspecified toe(s) without damage to nail, initial encounter: Secondary | ICD-10-CM

## 2016-10-16 MED ORDER — CIPROFLOXACIN HCL 500 MG PO TABS: 500.0000 mg | ORAL_TABLET | Freq: Two times a day (BID) | ORAL | 0 refills | Status: DC

## 2016-10-16 NOTE — ED Notes (Signed)
Appointment with Dr Denyse Amassorey, Monday 10/20/16 10:30 am

## 2016-10-16 NOTE — Progress Notes (Signed)
   Subjective:    I'm seeing this patient as a consultation for:  Junius FinnerErin O'Malley PA-C  CC: Left foot infection  HPI: Patient stepped on a nail on his left foot about a month ago. He was seen in urgent care and treated with oral clindamycin antibiotics. He notes this helps some but he continues to have pain and swelling and was seen in urgent care today and found to have a fracture at the same location of the puncture wound on his foot. He notes the skin is red and tender area he denies any fevers or chills.  Past medical history, Surgical history, Family history not pertinant except as noted below, Social history, Allergies, and medications have been entered into the medical record, reviewed, and no changes needed.   Review of Systems: No headache, visual changes, nausea, vomiting, diarrhea, constipation, dizziness, abdominal pain, skin rash, fevers, chills, night sweats, weight loss, swollen lymph nodes, body aches, joint swelling, muscle aches, chest pain, shortness of breath, mood changes, visual or auditory hallucinations.   Objective:   BP (!) 144/79 (BP Location: Left Arm)   Pulse 65   Temp 97.5 F (36.4 C) (Oral)   Ht 6\' 5"  (1.956 m)   Wt 256 lb (116.1 kg)   SpO2 99%   BMI 30.36 kg/m  General: Well Developed, well nourished, and in no acute distress.  Neuro/Psych: Alert and oriented x3, extra-ocular muscles intact, able to move all 4 extremities, sensation grossly intact. Skin: Warm and dry, no rashes noted.  Respiratory: Not using accessory muscles, speaking in full sentences, trachea midline.  Cardiovascular: Pulses palpable, no extremity edema. Abdomen: Does not appear distended. MSK: Left foot third toe erythematous and tender. Puncture wound plantar foot just distal to the MTP. Capillary refill is intact.  No results found for this or any previous visit (from the past 24 hour(s)). Dg Foot Complete Left  Result Date: 10/16/2016 CLINICAL DATA:  Stepped on nail 09/25/2016.   Pain at puncture site. EXAM: LEFT FOOT - COMPLETE 3+ VIEW COMPARISON:  None. FINDINGS: Posterior and plantar calcaneal spurs noted. There is a fracture at the base of the left third proximal phalanx at the MTP joint. Minimal displacement of the fracture fragment. No subluxation or dislocation. Soft tissues are intact. No radiopaque foreign body or soft tissue gas. IMPRESSION: Minimally displaced fracture at the base of the left third toe proximal phalanx. Electronically Signed   By: Charlett NoseKevin  Dover M.D.   On: 10/16/2016 11:53    Impression and Recommendations:    Assessment and Plan: [age] y.o. male with Open fracture with skin erythema. This is concerning for osteomyelitis. X-ray does not show osteomyelitis. Plan for MRI in the near future. Will switch antibiotics to Cipro for potential pseudomonas coverage. Recheck Monday or Tuesday. Plan for MRI over the weekend.   Discussed warning signs or symptoms. Please see discharge instructions. Patient expresses understanding.

## 2016-10-16 NOTE — ED Provider Notes (Signed)
CSN: [id number]     Arrival date & time 10/16/16  1059 History   First MD Initiated Contact with Patient 10/16/16 1125     Chief Complaint  Patient presents with  . Foot Injury   (Consider location/radiation/quality/duration/timing/severity/associated sxs/prior Treatment) HPI Corey Mayo is a 46 y.o. male presenting to UC with c/o gradually worsening pain, swelling and redness in his Left middle toe and foot that started initially 3 weeks ago after stepping on a nail that went through his shoe.  He was seen at a different urgent care on 09/25/16, started on Clindamycin QID for 12 days and bactroban.  Symptoms of redness, pain and swelling initially started to improve but have worsened over the last 4-5 days.  Denies fever, chills, n/v/d. Denies known injury to foot but notes no imaging was done initially after the puncture wound.  Pain is 3/10, he has been walking on his heel to help with the pain.  Denies hx of DM.  Last tetanus was in December 2017. Pt works as a Psychologist, clinicalguitar instructor.     Past Medical History:  Diagnosis Date  . Generalized anxiety disorder 01/03/2013  . Testicular cyst 01/03/2013   History reviewed. No pertinent surgical history. Family History  Problem Relation Age of Onset  . Adopted: Yes   Social History  Substance Use Topics  . Smoking status: Former Smoker    Types: Cigarettes    Quit date: 08/16/2012  . Smokeless tobacco: Never Used  . Alcohol use 0.5 oz/week    1 Standard drinks or equivalent per week    Review of Systems  Constitutional: Negative for chills and fever.  Gastrointestinal: Negative for diarrhea, nausea and vomiting.  Musculoskeletal: Positive for arthralgias, gait problem, joint swelling and myalgias.  Skin: Positive for color change and wound. Negative for rash.    Allergies  Patient has no known allergies.  Home Medications   Prior to Admission medications   Medication Sig Start Date End Date Taking? Authorizing Provider   ciprofloxacin (CIPRO) 500 MG tablet Take 1 tablet (500 mg total) by mouth 2 (two) times daily. One po bid x 7 days 10/16/16   Junius FinnerErin O'Malley, PA-C  sertraline (ZOLOFT) 100 MG tablet Take 1 tablet (100 mg total) by mouth daily. 02/06/16   Sean Hommel, DO  valACYclovir (VALTREX) 1000 MG tablet Take 1 tablet (1,000 mg total) by mouth 2 (two) times daily. For seven days, start ASAP if outbreak occurs. 02/07/16   Laren BoomSean Hommel, DO   Meds Ordered and Administered this Visit  Medications - No data to display  BP (!) 144/79 (BP Location: Left Arm)   Pulse 65   Temp 97.5 F (36.4 C) (Oral)   Ht 6\' 5"  (1.956 m)   Wt 256 lb (116.1 kg)   SpO2 99%   BMI 30.36 kg/m  No data found.   Physical Exam  Constitutional: He is oriented to person, place, and time. He appears well-developed and well-nourished.  HENT:  Head: Normocephalic and atraumatic.  Eyes: EOM are normal.  Neck: Normal range of motion.  Cardiovascular: Normal rate.   Pulses:      Dorsalis pedis pulses are 2+ on the left side.       Posterior tibial pulses are 2+ on the left side.  Pulmonary/Chest: Effort normal.  Musculoskeletal: Normal range of motion. He exhibits edema and tenderness.  Left middle toe: edematous compared to other toes. Slight decreased ROM of toe.  Mild to moderate swelling of Left foot and ankle  compared to Right. Full ROM ankle Calf is soft, non-tender.    Neurological: He is alert and oriented to person, place, and time.  Skin: Skin is warm and dry. Capillary refill takes less than 2 seconds. There is erythema.  Left middle toe, plantar aspect: superficial appears puncture wound. No bleeding or drainage. Erythema to toe and faint erythema into Left foot.   Psychiatric: He has a normal mood and affect. His behavior is normal.  Nursing note and vitals reviewed.   Urgent Care Course     Procedures (including critical care time)  Labs Review Labs Reviewed - No data to display  Imaging Review Dg Foot  Complete Left  Result Date: 10/16/2016 CLINICAL DATA:  Stepped on nail 09/25/2016.  Pain at puncture site. EXAM: LEFT FOOT - COMPLETE 3+ VIEW COMPARISON:  None. FINDINGS: Posterior and plantar calcaneal spurs noted. There is a fracture at the base of the left third proximal phalanx at the MTP joint. Minimal displacement of the fracture fragment. No subluxation or dislocation. Soft tissues are intact. No radiopaque foreign body or soft tissue gas. IMPRESSION: Minimally displaced fracture at the base of the left third toe proximal phalanx. Electronically Signed   By: Charlett Nose M.D.   On: 10/16/2016 11:53     MDM   1. Left foot infection   2. Open displaced fracture of proximal phalanx of lesser toe of left foot, initial encounter    Plain films c/w minimally displaced farcture at base of Left third toe proximal phalanx in location of puncture wound  Consulted with Dr. Denyse Amass, Sports Medicine. See consult note. Concern for open fracture and possible secondary osteomyelitis.  Will likely order MRI for further evaluation  Rx: Cipro f/u with Dr. Denyse Amass next week for further evaluation and ongoing treatment.  Pt declined pain medication and crutches.     Junius Finner, PA-C 10/16/16 1218

## 2016-10-16 NOTE — Addendum Note (Signed)
Addended by: Collie SiadICHARDSON, Samadhi Mahurin M on: 10/16/2016 01:42 PM   Modules accepted: Orders

## 2016-10-16 NOTE — ED Triage Notes (Signed)
Left foot injury, stepped on nail 3 weeks ago, was given Clindamycin and bactroban, finished ABT still using ointment, 3rd toe is red swollen, still painful.

## 2016-10-18 ENCOUNTER — Ambulatory Visit (HOSPITAL_BASED_OUTPATIENT_CLINIC_OR_DEPARTMENT_OTHER)
Admission: RE | Admit: 2016-10-18 | Discharge: 2016-10-18 | Disposition: A | Discharge status: Home / Self Care | Payer: BLUE CROSS/BLUE SHIELD | Source: Ambulatory Visit | Attending: Family Medicine | Admitting: Family Medicine

## 2016-10-18 DIAGNOSIS — S92502B Displaced unspecified fracture of left lesser toe(s), initial encounter for open fracture: Secondary | ICD-10-CM | POA: Diagnosis present

## 2016-10-18 DIAGNOSIS — M009 Pyogenic arthritis, unspecified: Secondary | ICD-10-CM | POA: Diagnosis not present

## 2016-10-18 DIAGNOSIS — S91139A Puncture wound without foreign body of unspecified toe(s) without damage to nail, initial encounter: Secondary | ICD-10-CM

## 2016-10-18 MED ORDER — GADOBENATE DIMEGLUMINE 529 MG/ML IV SOLN: 20.0000 mL | Freq: Once | INTRAVENOUS | Status: DC | PRN

## 2016-10-20 ENCOUNTER — Telehealth: Payer: Self-pay | Admitting: Family Medicine

## 2016-10-20 ENCOUNTER — Ambulatory Visit (INDEPENDENT_AMBULATORY_CARE_PROVIDER_SITE_OTHER): Payer: BLUE CROSS/BLUE SHIELD | Admitting: Family Medicine

## 2016-10-20 ENCOUNTER — Ambulatory Visit (INDEPENDENT_AMBULATORY_CARE_PROVIDER_SITE_OTHER): Payer: BLUE CROSS/BLUE SHIELD | Admitting: Podiatry

## 2016-10-20 ENCOUNTER — Encounter: Payer: Self-pay | Admitting: Family Medicine

## 2016-10-20 VITALS — BP 99/62 | HR 67

## 2016-10-20 DIAGNOSIS — S92502B Displaced unspecified fracture of left lesser toe(s), initial encounter for open fracture: Secondary | ICD-10-CM | POA: Diagnosis not present

## 2016-10-20 DIAGNOSIS — M869 Osteomyelitis, unspecified: Secondary | ICD-10-CM | POA: Insufficient documentation

## 2016-10-20 DIAGNOSIS — S99922A Unspecified injury of left foot, initial encounter: Secondary | ICD-10-CM

## 2016-10-20 DIAGNOSIS — M868X7 Other osteomyelitis, ankle and foot: Secondary | ICD-10-CM

## 2016-10-20 DIAGNOSIS — S91139A Puncture wound without foreign body of unspecified toe(s) without damage to nail, initial encounter: Secondary | ICD-10-CM | POA: Diagnosis not present

## 2016-10-20 DIAGNOSIS — M86172 Other acute osteomyelitis, left ankle and foot: Secondary | ICD-10-CM | POA: Diagnosis not present

## 2016-10-20 DIAGNOSIS — S92512A Displaced fracture of proximal phalanx of left lesser toe(s), initial encounter for closed fracture: Secondary | ICD-10-CM | POA: Diagnosis not present

## 2016-10-20 HISTORY — DX: Osteomyelitis, unspecified: M86.9

## 2016-10-20 MED ORDER — CIPROFLOXACIN HCL 500 MG PO TABS: 500.0000 mg | ORAL_TABLET | Freq: Two times a day (BID) | ORAL | 0 refills | Status: DC

## 2016-10-20 NOTE — Patient Instructions (Signed)
Seen for infected 3rd toe left following an old nail injury. Discussed findings from radiographic examination. Reviewed surgical intervention, removal of infected bone and cleanse out the area. Consent form reviewed for possible surgery this Friday.

## 2016-10-20 NOTE — Telephone Encounter (Signed)
Pt in for FU. Information reviewed.

## 2016-10-20 NOTE — Progress Notes (Signed)
SUBJECTIVE: 46 y.o. year old male presents for follow up on swollen left foot that is being treated for osteomyelitis . He had a puncture injury with a nail to the base of 3rd toe left foot on 09/25/16, which was treated with antibiotics initially. A month later he has developed 2nd infection and was treated at Urgent care for fractured 3rd digit proximal phalangeal base with osteomyelitis.  Stated that he is currently on Cipro 500 mg bid that was re ordered by his PCP on 10/20/16.  REVIEW OF SYSTEMS: Pertinent items noted in HPI and remainder of comprehensive ROS otherwise negative.  OBJECTIVE: DERMATOLOGIC EXAMINATION: Localized mildly swollen 3rd digit with old injured skin at the sulcus. Surface has healed off.  VASCULAR EXAMINATION OF LOWER LIMBS: All pedal pulses are palpable with normal pulsation.  Localized pain, mild edema with mild temperature limited to 3rd digit and 3rd metatarsophalangeal joint area left foot.   NEUROLOGIC EXAMINATION OF THE LOWER LIMBS: All epicritic and tactile sensations grossly intact.  MUSCULOSKELETAL EXAMINATION: High arched cavus type foot with localized edema and erythema on the 3rd digit and 3rd MPJ area left foot.  Reviewed radiographic studies done on 10/18/16.   Findings reveal intra articular phalangeal base fracture at medial 1/3 and positive of bone resorption involving fracture site proximal phalangeal base.   ASSESSMENT: 1. S/P Puncture wound with nail left foot 3rd digit base, 09/25/16. 2. 3rd Proximal phalangeal base fracture, intra articular, medial 1/3 of base was displaced slight medially. Triangular shape with broad base in articular surface and apex at the medial periosteum about 1.5 cm distal from the joint. 3. Possible osteomyelitis proximal phalangeal base left 3rd digit. 4. Recent episode of expanded soft tissue infection subsided with Cipro 500 mg bid.  PLAN: Reviewed clinical findings and available treatment options. Reviewed  surgery consent form for removal of infected part of the bone and clean out affected bone debris.

## 2016-10-20 NOTE — Telephone Encounter (Signed)
MRI shows a bone infection called osteomyelitis.  Please return to clinic to go over results.  We are referring to infectious disease clinic to help with antibiotics.

## 2016-10-20 NOTE — Patient Instructions (Signed)
Thank you for coming in today. You should hear from ID and podiatry today or tomorrow.  Continue cipro antibiotic.  Use the post op shoe.  Continue buddy tape.   Recheck in 2 weeks.  Let me know if things are changing.   Bone and Joint Infections, Adult Bone infections (osteomyelitis) and joint infections (septic arthritis) occur when bacteria or other germs get inside a bone or a joint. This can happen if you have an infection in another part of your body that spreads through your blood. Germs from your skin or from outside of your body can also cause this type of infection if you have a wound or a broken bone (fracture) that breaks the skin. Anyone can get a bone infection or joint infection. You may be more likely to get this type of infection if you have a condition, such as diabetes, that lowers your ability to fight infection or increases your chances of getting an infection. Bone and joint infections can cause damage, and they can spread to other areas of your body. They need to be treated quickly. What are the causes? Most bone and joint infections are caused by bacteria. They can also be caused by other germs, such as viruses and funguses. What increases the risk? This condition is more likely to develop in:  People who recently had surgery, especially bone or joint surgery.  People who have a long-term (chronic) disease, such as:  HIV (human immunodeficiency virus).  Diabetes.  Rheumatoid arthritis.  Sickle cell anemia.  Elderly people.  People who take medicines that block or weaken the body's defense system (immune system).  People who have a condition that reduces their blood flow.  People who are on kidney dialysis.  People who have an artificial joint.  People who have had a joint or bone repaired with plates or screws (surgical hardware).  People who use or abuse IV drugs.  People who have had trauma, such as stepping on a nail. What are the signs or  symptoms? Symptoms vary depending on the type and location of your infection. Common symptoms of bone and joint infections include:  Fever and chills.  Redness and warmth.  Swelling.  Pain and stiffness.  Drainage of fluid or pus near the infection.  Weight loss and fatigue.  Decreased ability to use a hand or foot. How is this diagnosed? This condition may be diagnosed based on symptoms, medical history, a physical exam, and diagnostic tests. Tests can help to identify the cause of the infection. You may have various tests, such as:  A sample of tissue, fluid, or blood taken to be examined under a microscope.  A procedure to remove fluid from the infected joint with a needle (joint aspiration) for testing in a lab.  Pus or discharge swabbed from a wound for testing to identify germs and to determine what type of medicine will kill them (culture and sensitivity).  Blood tests to look for evidence of infection and inflammation (biomarkers).  Imaging studies to determine how severe the bone or joint infection is. These may include:  X-rays.  CT scan.  MRI.  Bone scan. How is this treated? Treatment depends on the cause and type of infection. Antibiotic medicines are usually the first treatment for a bone or joint infection. Treatment with antibiotics may include:  Getting IV antibiotics. This may be done in a hospital at first. You may have to continue IV antibiotics at home for several weeks. You may also have to  take antibiotics by mouth for several weeks after that.  Taking more than one kind of antibiotic. Treatment may start with a type of antibiotic that works against many different bacteria (broad spectrumantibiotics). IV antibiotics may be changed if tests show that another type may work better. Other treatments may include:  Draining fluid from the joint by placing a needle into it (aspiration).  Surgery to remove:  Dead or dying tissue from a bone or  joint.  An infected artificial joint.  Infected plates or screws that were used to repair a broken bone. Follow these instructions at home:  Take medicines only as directed by your health care provider.  Take your antibiotic medicine as directed by your health care provider. Finish the antibiotic even if you start to feel better.  Follow instructions from your health care provider about how to take IV antibiotics at home.  Ask your health care provider if you have any restrictions on your activities.  Keep all follow-up visits as directed by your health care provider. This is important. Contact a health care provider if:  You have a fever or chills.  You have redness, warmth, pain, or swelling that returns after treatment. Get help right away if:  You have rapid breathing or you have trouble breathing.  You have chest pain.  You cannot drink fluids or make urine.  The affected arm or leg swells, changes color, or turns blue. This information is not intended to replace advice given to you by your health care provider. Make sure you discuss any questions you have with your health care provider. Document Released: 07/07/2005 Document Revised: 12/13/2015 Document Reviewed: 07/05/2014 Elsevier Interactive Patient Education  2017 [employer]    How to [employer] Buddy taping refers to taping an injured finger or toe to an uninjured finger or toe that is next to it. This protects the injured finger or toe and keeps it from moving while the injury heals. You may buddy tape a finger or toe if you have a minor sprain. Your health care provider may buddy tape your finger or toe if you have a sprain, dislocation, or fracture. You may be told to replace your buddy taping as needed. What are the risks? Generally, buddy taping is safe. However, problems may occur, such as:  Skin injury or infection.  Reduced blood flow to the finger or toe.  Skin reaction to the tape. Do not buddy  tape your toe if you have diabetes. Do not buddy tape if you know that you have an allergy to adhesives or surgical tape. How to buddy tape Before Buddy Taping  Try to reduce any pain and swelling with rest, icing, and elevation:  Avoid any activity that causes pain.  Raise (elevate) your hand or foot above the level of your heart while you are sitting or lying down.  If directed, apply ice to the injured area:  Put ice in a plastic bag.  Place a towel between your skin and the bag.  Leave the ice on for 20 minutes, 2-3 times per day. Buddy Taping Procedure   Clean and dry your finger or toe as told by your health care provider.  Place a gauze pad or a piece of cloth or cotton between your injured finger or toe and the uninjured finger or toe.  Use tape to wrap around both fingers or toes so your injured finger or toe is secured to the uninjured finger or toe.  The tape should be snug,  but not tight.  Make sure the ends of the piece of tape overlap.  Avoid placing tape directly over the joint.  Change the tape and the padding as told by your health care provider. Remove and replace the tape or padding if it becomes loose, worn, dirty, or wet. After Buddy Taping   Take over-the-counter and prescription medicines only as told by your health care provider.  Return to your normal activities as told by your health care provider. Ask your health care provider what activities are safe for you.  Watch the buddy-taped area and always remove buddy taping if:  Your pain gets worse.  Your fingers turn pale or blue.  Your skin becomes irritated. Contact a health care provider if:  You have pain, swelling, or bruising that lasts longer than three days.  You have a fever.  Your skin is red, cracked, or irritated. Get help right away if:  The injured area becomes cold, numb, or pale.  You have severe pain, swelling, bruising, or loss of movement in your finger or toe.  Your  finger or toe changes shape (deformity). This information is not intended to replace advice given to you by your health care provider. Make sure you discuss any questions you have with your health care provider. Document Released: 08/14/2004 Document Revised: 12/13/2015 Document Reviewed: 11/29/2014 Elsevier Interactive Patient Education  2017 [employer].

## 2016-10-20 NOTE — Progress Notes (Signed)
Corey Mayo is a 46 y.o. male who presents to [facility] today for follow-up open fracture with osteomyelitis. Patient was seen in urgent care on the 29th where he was diagnosed with a open fracture that had developed into osteomyelitis of the last several weeks. He at that time was having redness and pain and was put empirically on ciprofloxacin. In the interval he's had an MRI and is feeling better on the Cipro. He denies fevers or chills. He does note some continued pain especially at the MTP.   Past Medical History:  Diagnosis Date  . Generalized anxiety disorder 01/03/2013  . Testicular cyst 01/03/2013   No past surgical history on file. Social History  Substance Use Topics  . Smoking status: Former Smoker    Types: Cigarettes    Quit date: 08/16/2012  . Smokeless tobacco: Never Used  . Alcohol use 0.5 oz/week    1 Standard drinks or equivalent per week     ROS:  As above   Medications: Current Outpatient Prescriptions  Medication Sig Dispense Refill  . ciprofloxacin (CIPRO) 500 MG tablet Take 1 tablet (500 mg total) by mouth 2 (two) times daily. 60 tablet 0  . sertraline (ZOLOFT) 100 MG tablet Take 1 tablet (100 mg total) by mouth daily. 90 tablet 0  . valACYclovir (VALTREX) 1000 MG tablet Take 1 tablet (1,000 mg total) by mouth 2 (two) times daily. For seven days, start ASAP if outbreak occurs. 14 tablet 1   No current facility-administered medications for this visit.    No Known Allergies   Exam:  BP 99/62   Pulse 67  General: Well Developed, well nourished, and in no acute distress.  Neuro/Psych: Alert and oriented x3, extra-ocular muscles intact, able to move all 4 extremities, sensation grossly intact. Skin: Warm and dry, no rashes noted.  Respiratory: Not using accessory muscles, speaking in full sentences, trachea midline.  Cardiovascular: Pulses palpable, no extremity edema. Abdomen: Does not appear  distended. MSK: Left foot third toe mildly erythematous and tender with a dry tiny wound at the plantar proximal phalanx near the MTP. Pain with motion. Capillary refill sensation intact distally.    No results found for this or any previous visit (from the past 48 hour(s)). Mr Foot Left W Wo Contrast  Result Date: 10/19/2016 CLINICAL DATA:  Stepped on a nail 09/25/2016. Punctured left foot third toe. EXAM: MRI OF THE LEFT FOREFOOT WITHOUT AND WITH CONTRAST TECHNIQUE: Multiplanar, multisequence MR imaging of the left forefoot was performed both before and after administration of intravenous contrast. CONTRAST:  20 mL MultiHance COMPARISON:  None. FINDINGS: Bones/Joint/Cartilage Ununited fracture at the base of the third proximal phalanx. Severe marrow edema within the base of the third proximal phalanx with surrounding soft tissue edema and avid enhancement of the bone and soft tissues. Mild marrow edema in the head of the third metatarsal. Small third MTP joint effusion with synovitis. No other marrow signal abnormality. No other fracture or dislocation. Mild osteoarthritis of the first MTP joint. Normal alignment. Ligaments Collateral ligaments are intact.  Lisfranc ligament is intact. Muscles and Tendons Flexor, peroneal and extensor compartment tendons are intact. Mild increased T2 hyperintense signal throughout the plantar musculature without significant enhancement on postcontrast imaging likely reflecting reactive edema secondary to inflammation in the third toe. Soft tissue No fluid collection or hematoma.  No soft tissue mass. IMPRESSION: 1. Ununited fracture at the base of the third proximal phalanx. Findings most compatible with superimposed  osteomyelitis of the third proximal phalanx with septic arthritis of the third MTP joint. Mild marrow edema in the head of the third metatarsal without cortical destruction. Electronically Signed   By: Elige Ko   On: 10/19/2016 09:08      Assessment  and Plan: 46 y.o. male with osteomyelitis and septic arthritis following a not initially diagnosed open fracture.  Patient is already on empiric antibiotics. Continue Cipro as he seems to be improving on it. Additionally refer to infectious disease and to podiatry. Recheck in about 2 weeks.  Treat Fracture with buddy tape and postoperative shoe.    Orders Placed This Encounter  Procedures  . Ambulatory referral to Podiatry    Referral Priority:   Routine    Referral Type:   Consultation    Referral Reason:   Specialty Services Required    Requested Specialty:   Podiatry    Number of Visits Requested:   1    Discussed warning signs or symptoms. Please see discharge instructions. Patient expresses understanding.

## 2016-10-21 ENCOUNTER — Encounter: Payer: Self-pay | Admitting: Podiatry

## 2016-10-23 ENCOUNTER — Other Ambulatory Visit: Payer: Self-pay | Admitting: Podiatry

## 2016-10-23 MED ORDER — OXYCODONE-ACETAMINOPHEN 7.5-325 MG PO TABS
1.0000 | ORAL_TABLET | Freq: Four times a day (QID) | ORAL | 0 refills | Status: DC | PRN
Start: 2016-10-23 — End: 2016-11-03

## 2016-10-24 DIAGNOSIS — M2570 Osteophyte, unspecified joint: Secondary | ICD-10-CM | POA: Diagnosis not present

## 2016-10-24 HISTORY — PX: OTHER SURGICAL HISTORY: SHX169

## 2016-10-27 ENCOUNTER — Encounter: Payer: Self-pay | Admitting: *Deleted

## 2016-10-27 ENCOUNTER — Ambulatory Visit (INDEPENDENT_AMBULATORY_CARE_PROVIDER_SITE_OTHER): Payer: BLUE CROSS/BLUE SHIELD | Admitting: Podiatry

## 2016-10-27 DIAGNOSIS — S99922A Unspecified injury of left foot, initial encounter: Secondary | ICD-10-CM

## 2016-10-27 DIAGNOSIS — S92512A Displaced fracture of proximal phalanx of left lesser toe(s), initial encounter for closed fracture: Secondary | ICD-10-CM

## 2016-10-27 NOTE — Progress Notes (Signed)
1 week status post left foot surgery, removal of possible infected bone (10/24/16). Patient denies any severe discomfort. Dressing intact. Positive of internal bleeding thru surgical gauze. No edema noted. Surgical site has localized erythema with wet drain at surgery site.  No active drainage noted. Will irrigate wound with antibiotic solution tomorrow.

## 2016-10-27 NOTE — Patient Instructions (Signed)
1 week status post left foot surgery, removal of possible infected bone (10/24/16). Positive of dry blood thru surgical gauze.  Surgical site erythematous with wet drain at surgery site. No active drainage noted. Will irrigate wound with antibiotic solution tomorrow.

## 2016-10-28 ENCOUNTER — Ambulatory Visit (INDEPENDENT_AMBULATORY_CARE_PROVIDER_SITE_OTHER): Payer: BLUE CROSS/BLUE SHIELD | Admitting: Podiatry

## 2016-10-28 ENCOUNTER — Encounter: Payer: Self-pay | Admitting: Podiatry

## 2016-10-28 DIAGNOSIS — Z9889 Other specified postprocedural states: Secondary | ICD-10-CM

## 2016-10-28 NOTE — Patient Instructions (Signed)
Surgical wound irrigated with Normal saline with Gentamycin. Redressed with antibiotic soaked saline gauze wet to dry. Return in one week.

## 2016-10-28 NOTE — Progress Notes (Signed)
Status post resection of possible infected and fractured bone, 3rd proximal phalangeal base left foot (10/24/16). Stated that the foot feels better. Wound is dry without drainage. Reduced edema and erythema noted. Normal wound healing in progress. Area cleansed with Iodine. Surgical site irrigated with Normal saline with Gentamycin. Surgical site redressed. Return in 5 days.

## 2016-11-03 ENCOUNTER — Ambulatory Visit (INDEPENDENT_AMBULATORY_CARE_PROVIDER_SITE_OTHER): Payer: BLUE CROSS/BLUE SHIELD | Admitting: Family Medicine

## 2016-11-03 ENCOUNTER — Encounter: Payer: Self-pay | Admitting: Podiatry

## 2016-11-03 ENCOUNTER — Encounter: Payer: Self-pay | Admitting: Family Medicine

## 2016-11-03 ENCOUNTER — Ambulatory Visit (INDEPENDENT_AMBULATORY_CARE_PROVIDER_SITE_OTHER): Payer: BLUE CROSS/BLUE SHIELD | Admitting: Podiatry

## 2016-11-03 VITALS — BP 130/66 | HR 61 | Temp 97.6°F | Wt 265.0 lb

## 2016-11-03 DIAGNOSIS — M868X7 Other osteomyelitis, ankle and foot: Secondary | ICD-10-CM | POA: Diagnosis not present

## 2016-11-03 DIAGNOSIS — Z9889 Other specified postprocedural states: Secondary | ICD-10-CM

## 2016-11-03 NOTE — Patient Instructions (Signed)
Thank you for coming in today. We should probably see each other for a well exam this summer sometime.  Let me know if we need to change antibiotics.  I will follow along with Dr Raynald Kemp.

## 2016-11-03 NOTE — Progress Notes (Signed)
10 days post op follow up on right foot surgery, removal of phalangeal base 3rd right. Wound is clean and dry without edema or erythema. Minimum discomfort. No growth from bone culture notified. May discontinue Cipro. Will remove sutures next week.

## 2016-11-03 NOTE — Progress Notes (Signed)
       Renaud Celli Wehrly is a 46 y.o. male who presents to [facility]: Primary Care Sports Medicine today for follow-up osteomyelitis. Patient was seen in late March for left foot puncture wound open fracture and osteomyelitis. He was started on Cipro and referred to podiatry after MRI showed osteomyelitis. In the interim he had excision of infected bone. He's feeling much better notes significant improvement in pain. He denies fevers or chills.   Past Medical History:  Diagnosis Date  . Generalized anxiety disorder 01/03/2013  . 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 Partial Phalangectomy Left 10/24/2016   Left #3 toe   Social History  Substance Use Topics  . Smoking status: Former Smoker    Types: Cigarettes    Quit date: 08/16/2012  . Smokeless tobacco: Never Used  . Alcohol use 0.5 oz/week    1 Standard drinks or equivalent per week   family history is not on file. He was adopted.  ROS as above:  Medications: Current Outpatient Prescriptions  Medication Sig Dispense Refill  . ciprofloxacin (CIPRO) 500 MG tablet Take 1 tablet (500 mg total) by mouth 2 (two) times daily. 60 tablet 0  . sertraline (ZOLOFT) 100 MG tablet Take 1 tablet (100 mg total) by mouth daily. 90 tablet 0  . valACYclovir (VALTREX) 1000 MG tablet Take 1 tablet (1,000 mg total) by mouth 2 (two) times daily. For seven days, start ASAP if outbreak occurs. 14 tablet 1   No current facility-administered medications for this visit.    No Known Allergies  Health Maintenance Health Maintenance  Topic Date Due  . INFLUENZA VACCINE  02/18/2017  . TETANUS/TDAP  07/13/2026  . HIV Screening  Completed     Exam:  BP 130/66   Pulse 61   Temp 97.6 F (36.4 C) (Oral)   Wt 265 lb (120.2 kg)   BMI 31.42 kg/m  Gen: Well NAD Left foot well appearing surgical incision with  well-appearing sutures no significant erythema or exudate. Nontender.   No results found for this or any previous visit (from the past 72 hour(s)). No results found.    Assessment and Plan: 46 y.o. male with resolving osteomyelitis. Continue oral antibiotics. Defer management to podiatry.  Recheck in July for wellness visit.   No orders of the defined types were placed in this encounter.  No orders of the defined types were placed in this encounter.    Discussed warning signs or symptoms. Please see discharge instructions. Patient expresses understanding.

## 2016-11-03 NOTE — Patient Instructions (Signed)
Normal wound healing with no growth in cultured bone. Dressing changed. Return in one week for suture removal.

## 2016-11-10 ENCOUNTER — Ambulatory Visit (INDEPENDENT_AMBULATORY_CARE_PROVIDER_SITE_OTHER): Payer: BLUE CROSS/BLUE SHIELD | Admitting: Podiatry

## 2016-11-10 ENCOUNTER — Encounter: Payer: Self-pay | Admitting: Podiatry

## 2016-11-10 DIAGNOSIS — Z9889 Other specified postprocedural states: Secondary | ICD-10-CM

## 2016-11-10 NOTE — Progress Notes (Signed)
Subjective: 2 weeks post op follow up on right foot surgery, removal of phalangeal base 3rd right (10/24/16). Patient denies any problem with surgical site.  Objective: Incision site at the base of the 3rd digit is clean and dry without edema or erythema.  Minimum discomfort.  Assessment: Satisfactory recovery from phalangeal base resection following a complication from a puncture wound that caused phalangeal base fracture with on going edema, erythema, and pain right foot.  Plan: Sutures removed. Home care instruction with supply dispensed. May return as needed.

## 2016-11-10 NOTE — Patient Instructions (Signed)
Sutures removed. Home care instruction with supply dispensed. May return as needed.

## 2016-12-29 ENCOUNTER — Other Ambulatory Visit: Payer: Self-pay | Admitting: *Deleted

## 2017-02-10 ENCOUNTER — Encounter: Payer: Self-pay | Admitting: Family Medicine

## 2017-02-10 ENCOUNTER — Ambulatory Visit (INDEPENDENT_AMBULATORY_CARE_PROVIDER_SITE_OTHER): Payer: BLUE CROSS/BLUE SHIELD | Admitting: Family Medicine

## 2017-02-10 VITALS — BP 137/86 | HR 56 | Wt 271.0 lb

## 2017-02-10 DIAGNOSIS — F411 Generalized anxiety disorder: Secondary | ICD-10-CM

## 2017-02-10 DIAGNOSIS — E785 Hyperlipidemia, unspecified: Secondary | ICD-10-CM

## 2017-02-10 DIAGNOSIS — Z0000 Encounter for general adult medical examination without abnormal findings: Secondary | ICD-10-CM

## 2017-02-10 DIAGNOSIS — E781 Pure hyperglyceridemia: Secondary | ICD-10-CM

## 2017-02-10 NOTE — Progress Notes (Signed)
Corey Mayo is a 46 y.o. male who presents to [facility]: Primary Care Sports Medicine today for well adult physical. Corey Mayo is doing pretty well. He takes Zoloft daily which helps to control his anxiety and depression symptoms. He exercises regularly over 60 minutes per day. He has lost a great deal of weight in the past with food log and but notes that he's plateaued her regain some weight. He notes it is not food log pain or measuring his food as rigorously as he normally would do anything he's eating more than his 2000-calorie per day goal.  Overall he feels quite well.   Past Medical History:  Diagnosis Date  . Generalized anxiety disorder 01/03/2013  . 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 Osteomyelitis (HCC) 10/20/2016   Left 3rd toe  . 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 Partial Phalangectomy Left 10/24/2016   Left #3 toe   Social History  Substance Use Topics  . Smoking status: Former Smoker    Types: Cigarettes    Quit date: 08/16/2012  . Smokeless tobacco: Never Used  . Alcohol use 0.5 oz/week    1 Standard drinks or equivalent per week   family history is not on file. He was adopted.  ROS as above:  Medications: Current Outpatient Prescriptions  Medication Sig Dispense Refill  . sertraline (ZOLOFT) 100 MG tablet Take 1 tablet (100 mg total) by mouth daily. 90 tablet 0   No current facility-administered medications for this visit.    No Known Allergies  Health Maintenance Health Maintenance  Topic Date Due  . INFLUENZA VACCINE  02/18/2017  . TETANUS/TDAP  07/13/2026  . HIV Screening  Completed     Exam:  BP 137/86   Pulse (!) 56   Wt 271 lb (122.9 kg)   SpO2 97%   BMI 32.14 kg/m   Wt Readings from Last 10 Encounters:  02/10/17 271 lb (122.9 kg)  11/03/16 265 lb (120.2 kg)  10/16/16 256 lb (116.1 kg)  07/13/16 239  lb (108.4 kg)  02/06/16 239 lb (108.4 kg)  01/23/14 275 lb (124.7 kg)  04/18/13 (!) 302 lb (137 kg)  01/11/13 (!) 340 lb (154.2 kg)  01/03/13 (!) 335 lb (152 kg)    Gen: Well NAD HEENT: EOMI,  MMM Lungs: Normal work of breathing. CTABL Heart: RRR no MRG Abd: NABS, Soft. Nondistended, Nontender Exts: Brisk capillary refill, warm and well perfused.  Psych: Alert and oriented normal speech thought process and affect.  Depression screen PHQ 2/9 02/10/2017  Decreased Interest 1  Down, Depressed, Hopeless 1  PHQ - 2 Score 2   GAD 7 : Generalized Anxiety Score 02/10/2017  Nervous, Anxious, on Edge 1  Control/stop worrying 0  Worry too much - different things 0  Trouble relaxing 0  Restless 0  Easily annoyed or irritable 1  Afraid - awful might happen 0  Total GAD 7 Score 2  Anxiety Difficulty Not difficult at all       Assessment and Plan: 46 y.o. male with well adult. Doing quite well. Focus today on diet and weight loss. Plan to restart food log and measuring. Continue exercising. Plan for 2000-2500 calories per day. This will result in approximately 2 pound per week weight loss. Recheck as needed. Fasting labs in the near future.   Orders Placed This Encounter  Procedures  . COMPLETE METABOLIC PANEL WITH GFR  . Lipid Panel w/reflex  Direct LDL   No orders of the defined types were placed in this encounter.    Discussed warning signs or symptoms. Please see discharge instructions. Patient expresses understanding.

## 2017-02-10 NOTE — Patient Instructions (Addendum)
Thank you for coming in today. I recommend getting back to basics. Log Food  Measure your food as well.   Continue exercises.   2000 kcals per day is a good goal.   Reduce carbs.   Recheck yearly or sooner if needed.    Get fasitng labs soon.

## 2018-01-28 IMAGING — DX DG FOOT COMPLETE 3+V*L*
3 series · 3 of 3 positions shown · non-contrast
Comparison: None.

CLINICAL DATA: Stepped on nail 09/25/2016.  Pain at puncture site.

EXAM:
LEFT FOOT - COMPLETE 3+ VIEW

[foot ap]
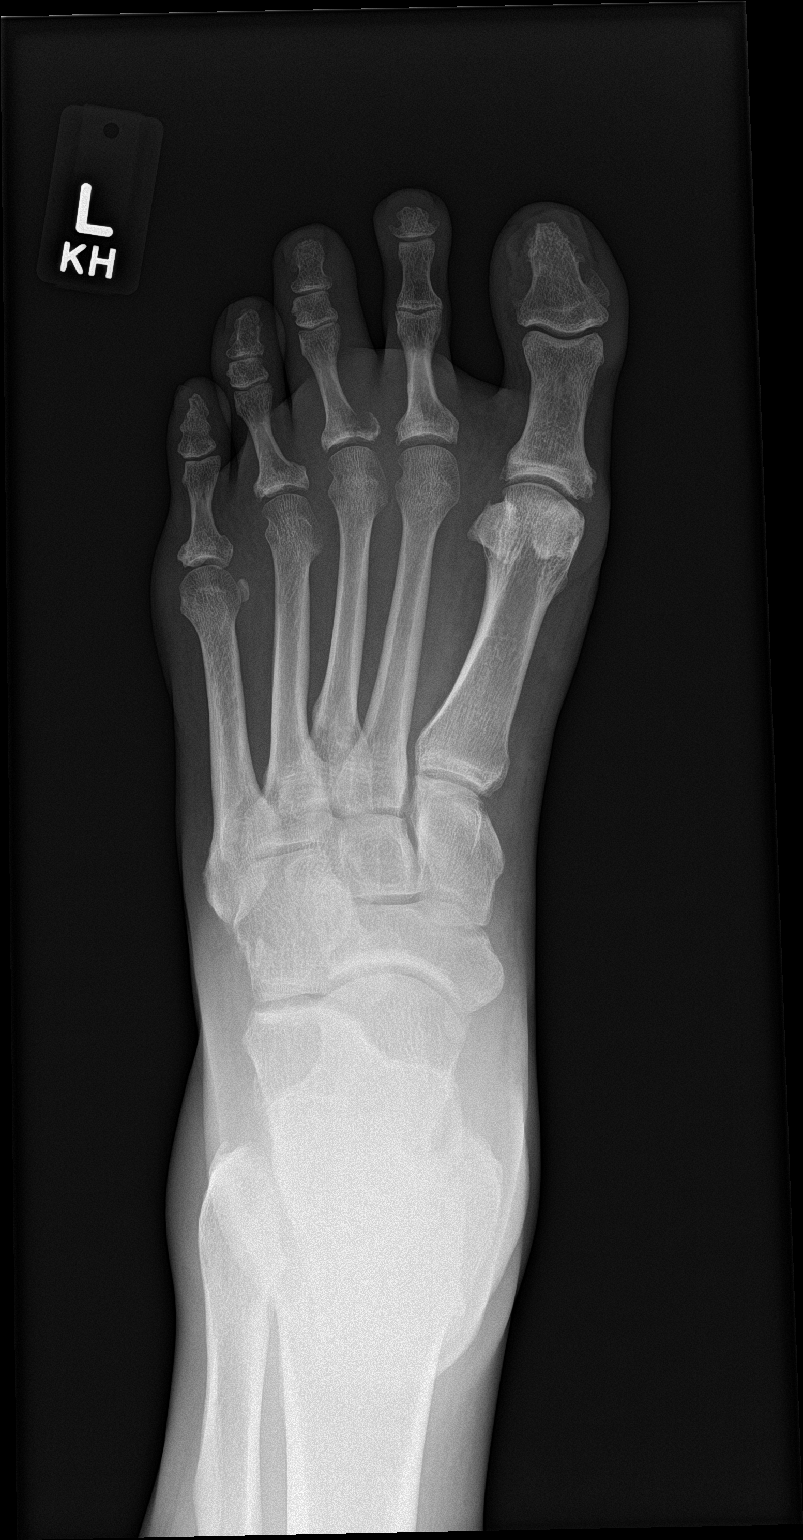

[foot obl]
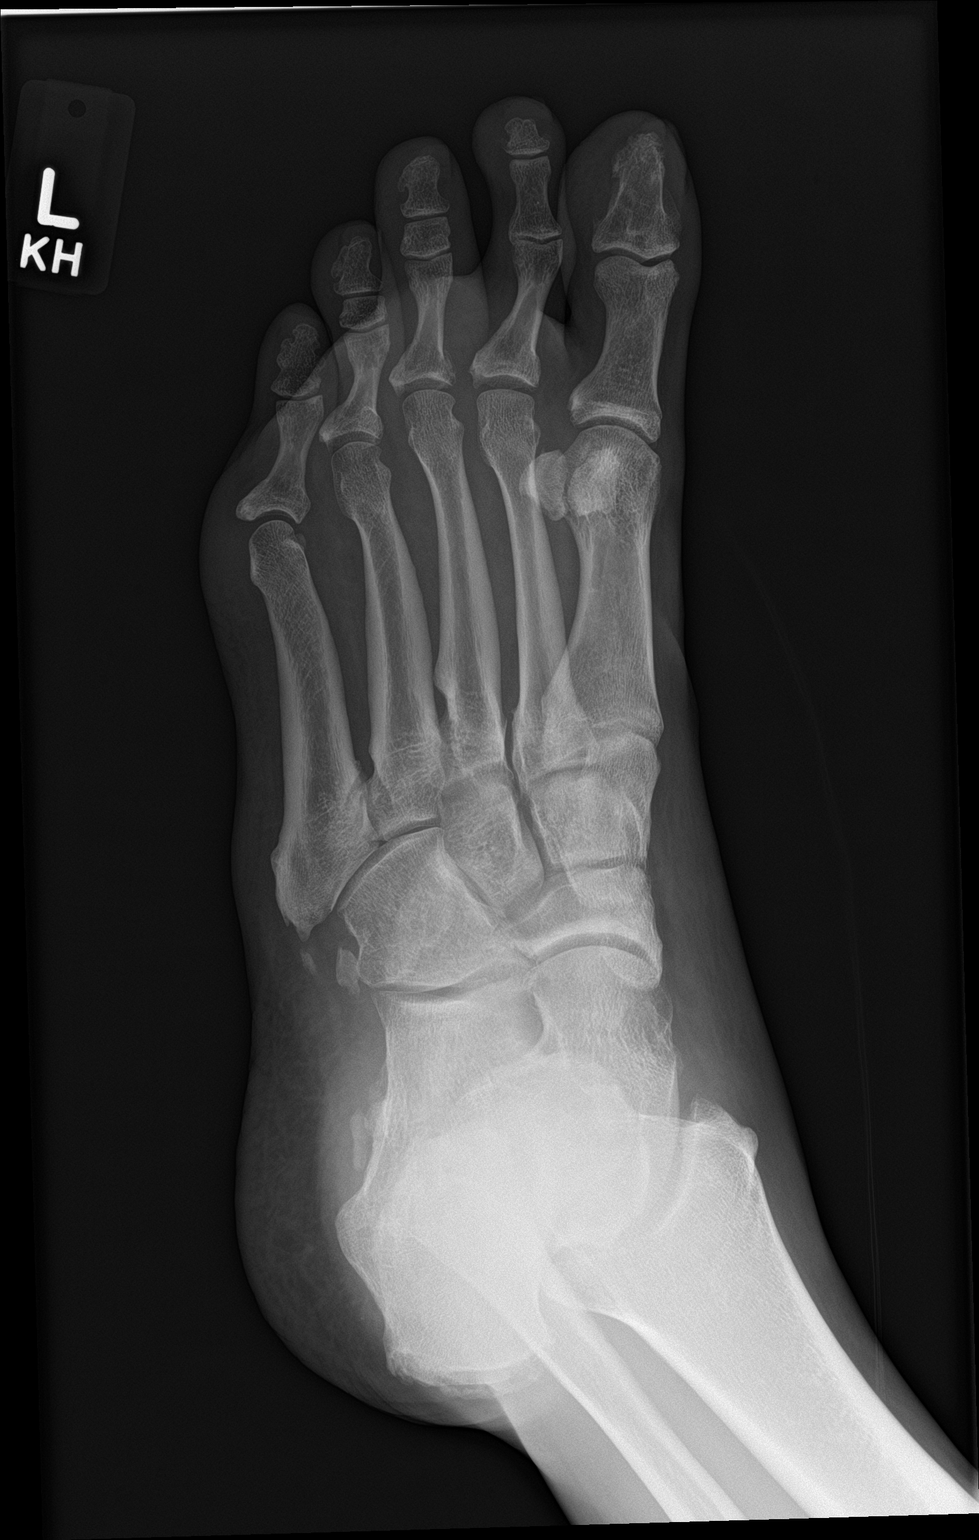

[foot lat]
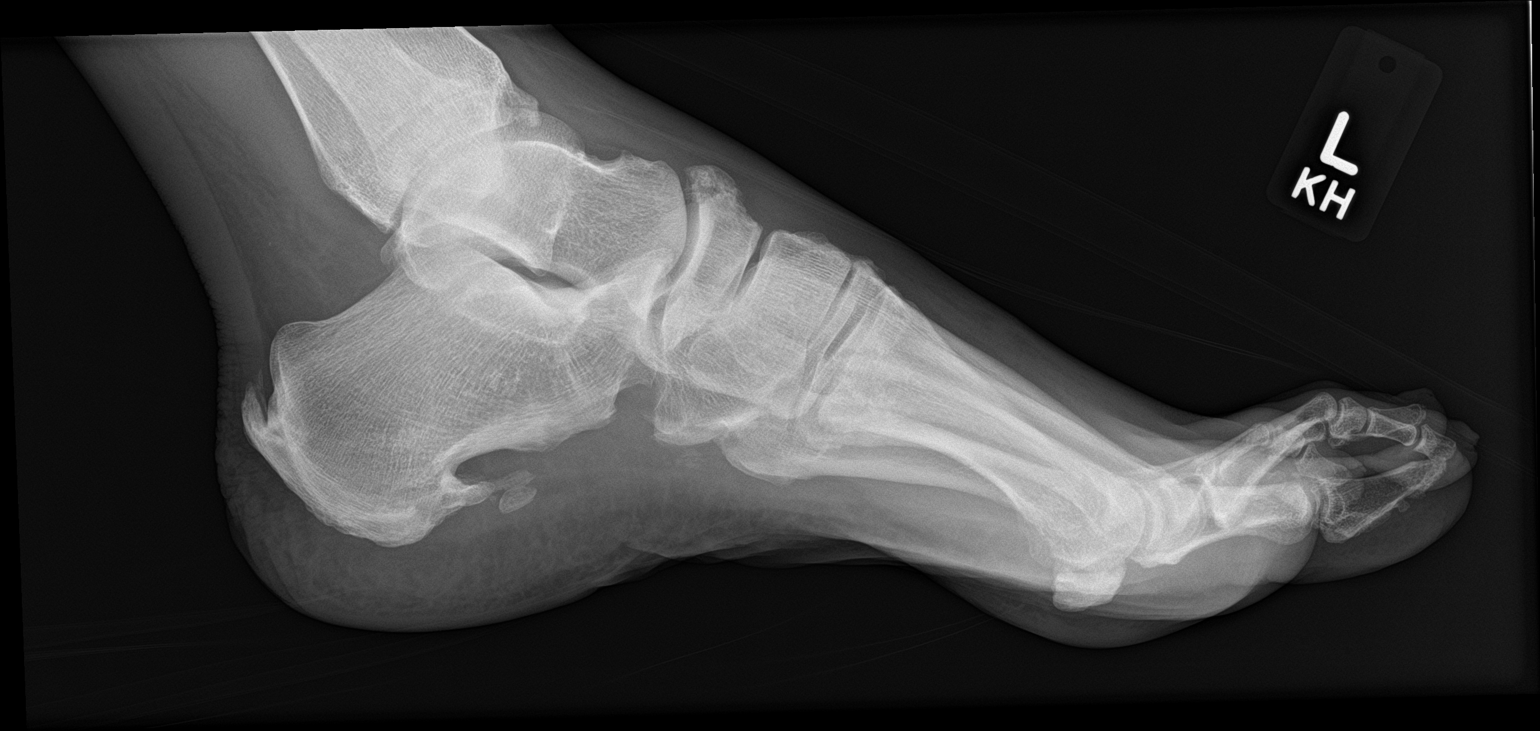

[3 of 3 positions shown; findings below may reference images not displayed]

FINDINGS: Posterior and plantar calcaneal spurs noted. There is a fracture at
the base of the left third proximal phalanx at the MTP joint.
Minimal displacement of the fracture fragment. No subluxation or
dislocation. Soft tissues are intact. No radiopaque foreign body or
soft tissue gas.
IMPRESSION: Minimally displaced fracture at the base of the left third toe
proximal phalanx.

## 2021-11-29 ENCOUNTER — Emergency Department
Admission: EM | Admit: 2021-11-29 | Discharge: 2021-11-29 | Disposition: A | Discharge status: Home / Self Care | Payer: 59 | Source: Home / Self Care | Attending: Family Medicine | Admitting: Family Medicine

## 2021-11-29 DIAGNOSIS — S61432A Puncture wound without foreign body of left hand, initial encounter: Secondary | ICD-10-CM

## 2021-11-29 DIAGNOSIS — Z23 Encounter for immunization: Secondary | ICD-10-CM

## 2021-11-29 DIAGNOSIS — W540XXA Bitten by dog, initial encounter: Secondary | ICD-10-CM | POA: Diagnosis not present

## 2021-11-29 MED ORDER — TETANUS-DIPHTH-ACELL PERTUSSIS 5-2.5-18.5 LF-MCG/0.5 IM SUSY
0.5000 mL | PREFILLED_SYRINGE | Freq: Once | INTRAMUSCULAR | Status: AC
  Administered 2021-11-29: 0.5 mL via INTRAMUSCULAR

## 2021-11-29 MED ORDER — AMOXICILLIN-POT CLAVULANATE 875-125 MG PO TABS: ORAL_TABLET | ORAL | 0 refills | Status: DC

## 2021-11-29 NOTE — ED Triage Notes (Addendum)
Pt presents to Urgent Care with c/o dog bites to L hand and forearm and R index finger. Reports he was breaking up a fight between his dogs. Dog is up to date w/ rabies vaccines. Pt states he thinks he has had Tdap within the past ten years, but he isn't sure. ?

## 2021-11-29 NOTE — Discharge Instructions (Signed)
Change dressing daily and apply Bacitracin ointment to wound.  Keep wound clean and dry.  Return for any signs of infection (or follow-up with family doctor):  Increasing redness, swelling, pain, heat, drainage, etc.    

## 2021-11-29 NOTE — ED Provider Notes (Signed)
?[facility] ? ? ? ?CSN: [id number] ?Arrival date & time: 11/29/21  1342 ? ? ?  ? ?History   ?Chief Complaint ?Chief Complaint  ?Patient presents with  ? Animal Bite  ? ? ?HPI ?Corey Mayo is a 51 y.o. male.  ? ?While separating his two fighting dogs today, he received a bite to his left hand, and abrasions to left forearm and right index finger.  His last Tdap was 07/13/16.  His dogs are fully immunized for rabies. ? ?The history is provided by the patient.  ?Animal Bite ?Pain details:  ?  Quality:  Aching ?  Severity:  Mild ?  Timing:  Constant ?  Progression:  Worsening ?Incident location:  Home ?Provoked: provoked   ?Notifications:  None ?Animal's rabies vaccination status:  Up to date ?Animal in possession: yes   ?Tetanus status:  Up to date ?Associated symptoms: swelling   ?Associated symptoms: no numbness   ? ?Past Medical History:  ?Diagnosis Date  ? Generalized anxiety disorder 01/03/2013  ? 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 Osteomyelitis (HCC) 10/20/2016  ? Left 3rd toe  ? 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 ? ?Patient Active Problem List  ? Diagnosis Date Noted  ? Fracture of second toe, left, open, initial encounter 10/16/2016  ? 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 Hyperlipidemia LDL goal <160 01/04/2013  ? Hypertriglyceridemia 01/04/2013  ? 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 Generalized anxiety disorder 01/03/2013  ? ? ?Past Surgical History:  ?Procedure Laterality Date  ? Partial Phalangectomy Left 10/24/2016  ? Left #3 toe  ? ? ? ? ? ?Home Medications   ? ?Prior to Admission medications   ?Medication Sig Start Date End Date Taking? Authorizing Provider  ?amoxicillin-clavulanate (AUGMENTIN) 875-125 MG tablet Take one tab PO Q12hr with food 11/29/21  Yes Bodee Lafoe, Tera Mater, MD  ?sertraline (ZOLOFT) 100 MG tablet Take 1 tablet (100 mg total) by mouth daily. 02/06/16   Laren Boom, DO  ? ? ?Family History ?Family History  ?Adopted: Yes  ?Problem Relation Age of Onset  ? COPD Mother   ? Heart attack Father    ? ? ?Social History ?Social History  ? ?Tobacco Use  ? Smoking status: Former  ?  Types: Cigarettes  ?  Quit date: 08/16/2012  ?  Years since quitting: 9.2  ? Smokeless tobacco: Never  ?Vaping Use  ? Vaping Use: Never used  ?Substance Use Topics  ? Alcohol use: Yes  ?  Alcohol/week: 2.0 standard drinks  ?  Types: 2 Standard drinks or equivalent per week  ?  Comment: weekly  ? Drug use: Yes  ?  Frequency: 2.0 times per week  ?  Types: Marijuana  ? ? ? ?Allergies   ?Patient has no known allergies. ? ? ?Review of Systems ?Review of Systems  ?Skin:  Positive for color change and wound.  ?Neurological:  Negative for numbness.  ?All other systems reviewed and are negative. ? ? ?Physical Exam ?Triage Vital Signs ?ED Triage Vitals  ?Enc Vitals Group  ?   BP 11/29/21 1409 (!) 159/91  ?   Pulse Rate 11/29/21 1409 90  ?   Resp 11/29/21 1409 18  ?   Temp 11/29/21 1409 99.1 ?F (37.3 ?C)  ?   Temp Source 11/29/21 1409 Oral  ?   SpO2 11/29/21 1409 95 %  ?   Weight 11/29/21 1405 300 lb (136.1 kg)  ?   Height 11/29/21 1405 6\' 5"  (1.956 m)  ?   Head Circumference --   ?  Peak Flow --   ?   Pain Score 11/29/21 1404 7  ?   Pain Loc --   ?   Pain Edu? --   ?   Excl. in GC? --   ? ?No data found. ? ?Updated Vital Signs ?BP (!) 159/91   Pulse 90   Temp 99.1 ?F (37.3 ?C) (Oral)   Resp 18   Ht 6\' 5"  (1.956 m)   Wt 136.1 kg   SpO2 95%   BMI 35.57 kg/m?  ? ?Visual Acuity ?Right Eye Distance:   ?Left Eye Distance:   ?Bilateral Distance:   ? ?Right Eye Near:   ?Left Eye Near:    ?Bilateral Near:    ? ?Physical Exam ?Vitals and nursing note reviewed.  ?Constitutional:   ?   General: He is not in acute distress. ?HENT:  ?   Head: Atraumatic.  ?Eyes:  ?   Pupils: Pupils are equal, round, and reactive to light.  ?Cardiovascular:  ?   Rate and Rhythm: Normal rate.  ?Pulmonary:  ?   Effort: Pulmonary effort is normal.  ?Musculoskeletal:  ?     Hands: ? ?   Comments: Patient has a 34mm wide puncture wound over his left thenar eminence.  There  are some superficial minimal abrasions on his left lateral forearm and his right second finger.  All fingers have full range of motion.  Distal sensation and neurovascular function is intact in all fingers.  ?Skin: ?   General: Skin is warm and dry.  ?Neurological:  ?   Mental Status: He is alert.  ? ? ?UC Treatments / Results  ?Labs ?(all labs ordered are listed, but only abnormal results are displayed) ?Labs Reviewed - No data to display ? ?EKG ? ? ?Radiology ?No results found. ? ?Procedures ?Procedures (including critical care time) ? ?Medications Ordered in UC ?Medications  ?Tdap (BOOSTRIX) injection 0.5 mL (has no administration in time range)  ? ? ?Initial Impression / Assessment and Plan / UC Course  ?I have reviewed the triage vital signs and the nursing notes. ? ?Pertinent labs & imaging results that were available during my care of the patient were reviewed by me and considered in my medical decision making (see chart for details). ? ?  ?Puncture wound left hand cleansed, and bacitracin/bandage applied. ?Administered Tdap ?Begin Augmentin. ?Followup with Family Doctor if not improved in about 5 days. ? ?Final Clinical Impressions(s) / UC Diagnoses  ? ?Final diagnoses:  ?Dog bite, initial encounter  ? ? ? ?Discharge Instructions   ? ?  ?Change dressing daily and apply Bacitracin ointment to wound.  Keep wound clean and dry.  Return for any signs of infection (or follow-up with family doctor):  Increasing redness, swelling, pain, heat, drainage, etc. ?  ? ? ? ? ?ED Prescriptions   ? ? Medication Sig Dispense Auth. Provider  ? amoxicillin-clavulanate (AUGMENTIN) 875-125 MG tablet Take one tab PO Q12hr with food 14 tablet 4m, MD  ? ?  ? ? ?  ?Lattie Haw, MD ?11/29/21 1642 ? ?

## 2022-01-24 DIAGNOSIS — R5381 Other malaise: Secondary | ICD-10-CM | POA: Diagnosis not present

## 2022-01-24 DIAGNOSIS — R030 Elevated blood-pressure reading, without diagnosis of hypertension: Secondary | ICD-10-CM | POA: Diagnosis not present

## 2022-01-24 DIAGNOSIS — R5383 Other fatigue: Secondary | ICD-10-CM | POA: Diagnosis not present

## 2022-01-24 DIAGNOSIS — R002 Palpitations: Secondary | ICD-10-CM | POA: Insufficient documentation

## 2022-01-24 DIAGNOSIS — Z8249 Family history of ischemic heart disease and other diseases of the circulatory system: Secondary | ICD-10-CM | POA: Insufficient documentation

## 2022-01-24 DIAGNOSIS — R0609 Other forms of dyspnea: Secondary | ICD-10-CM | POA: Insufficient documentation

## 2022-01-30 DIAGNOSIS — I10 Essential (primary) hypertension: Secondary | ICD-10-CM | POA: Diagnosis not present

## 2022-03-11 DIAGNOSIS — H524 Presbyopia: Secondary | ICD-10-CM | POA: Diagnosis not present

## 2022-03-11 DIAGNOSIS — H52203 Unspecified astigmatism, bilateral: Secondary | ICD-10-CM | POA: Diagnosis not present

## 2022-03-11 DIAGNOSIS — R7989 Other specified abnormal findings of blood chemistry: Secondary | ICD-10-CM | POA: Diagnosis not present

## 2022-03-11 DIAGNOSIS — H40003 Preglaucoma, unspecified, bilateral: Secondary | ICD-10-CM | POA: Diagnosis not present

## 2022-03-11 DIAGNOSIS — Z8249 Family history of ischemic heart disease and other diseases of the circulatory system: Secondary | ICD-10-CM | POA: Diagnosis not present

## 2022-03-11 DIAGNOSIS — H5213 Myopia, bilateral: Secondary | ICD-10-CM | POA: Diagnosis not present

## 2022-03-11 DIAGNOSIS — H3554 Dystrophies primarily involving the retinal pigment epithelium: Secondary | ICD-10-CM | POA: Diagnosis not present

## 2022-03-11 DIAGNOSIS — R002 Palpitations: Secondary | ICD-10-CM | POA: Diagnosis not present

## 2022-03-11 DIAGNOSIS — I10 Essential (primary) hypertension: Secondary | ICD-10-CM | POA: Diagnosis not present

## 2022-09-16 DIAGNOSIS — I10 Essential (primary) hypertension: Secondary | ICD-10-CM | POA: Diagnosis not present

## 2022-09-16 DIAGNOSIS — R0609 Other forms of dyspnea: Secondary | ICD-10-CM | POA: Diagnosis not present

## 2022-09-16 DIAGNOSIS — Z8249 Family history of ischemic heart disease and other diseases of the circulatory system: Secondary | ICD-10-CM | POA: Diagnosis not present

## 2023-01-19 ENCOUNTER — Encounter: Payer: Self-pay | Admitting: Emergency Medicine

## 2023-01-19 ENCOUNTER — Ambulatory Visit
Admission: EM | Admit: 2023-01-19 | Discharge: 2023-01-19 | Disposition: A | Discharge status: Home / Self Care | Payer: 59 | Attending: Family Medicine | Admitting: Family Medicine

## 2023-01-19 ENCOUNTER — Ambulatory Visit (INDEPENDENT_AMBULATORY_CARE_PROVIDER_SITE_OTHER): Payer: 59

## 2023-01-19 DIAGNOSIS — R1011 Right upper quadrant pain: Secondary | ICD-10-CM | POA: Diagnosis not present

## 2023-01-19 DIAGNOSIS — R0781 Pleurodynia: Secondary | ICD-10-CM

## 2023-01-19 DIAGNOSIS — K760 Fatty (change of) liver, not elsewhere classified: Secondary | ICD-10-CM | POA: Diagnosis not present

## 2023-01-19 MED ORDER — IBUPROFEN 800 MG PO TABS: 800.0000 mg | ORAL_TABLET | Freq: Three times a day (TID) | ORAL | 0 refills | Status: DC

## 2023-01-19 NOTE — ED Triage Notes (Signed)
Patient c/o RUQ pain x 1 day that radiates to his collar bone.  Pain increased last night.  Worse when he takes a deep breathe.  No injury.  Patient has taken Advil for pain.

## 2023-01-19 NOTE — ED Provider Notes (Signed)
[facility]    CSN: [id number] Arrival date & time: 01/19/23  1114      History   Chief Complaint Chief Complaint  Patient presents with   Abdominal Pain    HPI Corey Mayo is a 52 y.o. male.   HPI  Patient is here for pain in his right side.  He states that it hurts when he takes a deep breath.  It hurts with certain food.  At times he has collarbone and right scapula at times.  This has been going on for several days.  It is getting worse over time.  He cannot tell that certain foods trigger the pain.  No nausea or vomiting.  No change in bowels.  No fever or chills.  No recent illness or cough.   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 Authorizing Provider  amLODipine (NORVASC) 5 MG tablet Take 5 mg by mouth daily. 01/31/22  Yes [provider]  ibuprofen (ADVIL) 800 MG tablet Take 1 tablet (800 mg total) by mouth 3 (three) times daily. 01/19/23  Yes Eustace Moore, MD  sertraline (ZOLOFT) 100 MG tablet Take 1 tablet (100 mg total) by mouth daily. 02/06/16  Yes Laren Boom, DO    Family History Family History  Adopted: Yes  Problem Relation Age of Onset   COPD Mother    Heart attack Father     Social History Social History   Tobacco Use   Smoking status: Former    Types:  Cigarettes    Quit date: 08/16/2012    Years since quitting: 10.4   Smokeless tobacco: Never  Vaping Use   Vaping Use: Never used  Substance Use Topics   Alcohol use: Yes    Alcohol/week: 2.0 standard drinks of alcohol    Types: 2 Standard drinks or equivalent per week    Comment: weekly   Drug use: Yes    Frequency: 2.0 times per week    Types: Marijuana     Allergies   Patient has no known allergies.   Review of Systems Review of Systems  See HPI Physical Exam Triage Vital Signs ED Triage Vitals  Enc Vitals Group     BP 01/19/23 1134 (!) 164/98     Pulse Rate 01/19/23 1134 73     Resp 01/19/23 1134 18     Temp 01/19/23 1134 98.5 F (36.9 C)     Temp Source 01/19/23 1134 Oral     SpO2 01/19/23 1134 95 %     Weight 01/19/23 1135 300 lb (136.1 kg)     Height 01/19/23 1135 6\' 5"  (1.956 m)     Head Circumference --      Peak Flow --      Pain Score 01/19/23 1135 3  Pain Loc --      Pain Edu? --      Excl. in GC? --    No data found.  Updated Vital Signs BP (!) 164/98 (BP Location: Left Arm)   Pulse 73   Temp 98.5 F (36.9 C) (Oral)   Resp 18   Ht 6\' 5"  (1.956 m)   Wt 136.1 kg   SpO2 95%   BMI 35.57 kg/m       Physical Exam Constitutional:      General: He is not in acute distress.    Appearance: He is well-developed.     Comments: Appears uncomfortable  HENT:     Head: Normocephalic and atraumatic.     Right Ear: Tympanic membrane and ear canal normal.     Left Ear: Tympanic membrane and ear canal normal.     Nose: No congestion.     Mouth/Throat:     Pharynx: No posterior oropharyngeal erythema.  Eyes:     Conjunctiva/sclera: Conjunctivae normal.     Pupils: Pupils are equal, round, and reactive to light.  Cardiovascular:     Rate and Rhythm: Normal rate and regular rhythm.     Heart sounds: Normal heart sounds.  Pulmonary:     Effort: Pulmonary effort is normal. No respiratory distress.     Breath sounds: Normal breath sounds.   Abdominal:     General: Bowel sounds are normal. There is no distension.     Palpations: Abdomen is soft.     Tenderness: There is abdominal tenderness.     Comments: No chest wall tenderness.  Mild tenderness to deep palpation in right upper quadrant.  Cannot palpate liver edge.  Musculoskeletal:        General: Normal range of motion.     Cervical back: Normal range of motion and neck supple.  Skin:    General: Skin is warm and dry.  Neurological:     General: No focal deficit present.     Mental Status: He is alert.     Gait: Gait normal.      UC Treatments / Results  Labs (all labs ordered are listed, but only abnormal results are displayed) Labs Reviewed - No data to display  EKG   Radiology US Abdomen Limited RUQ (LIVER/GB)  Result Date: 01/19/2023 CLINICAL DATA:  Pain right upper quadrant of abdomen EXAM: ULTRASOUND ABDOMEN LIMITED RIGHT UPPER QUADRANT COMPARISON:  None Available. FINDINGS: Gallbladder: No gallstones or wall thickening visualized. No sonographic Murphy sign noted by sonographer. Common bile duct: Diameter: 4 mm Liver: There is increased echogenicity suggesting fatty infiltration. No focal abnormalities are seen in visualized portions of liver. Portal vein is patent on color Doppler imaging with normal direction of blood flow towards the liver. Other: None. IMPRESSION: Fatty infiltration liver. No other sonographic abnormalities are seen in right upper quadrant of abdomen. Electronically Signed   By: Ernie Avena M.D.   On: 01/19/2023 12:51   DG Chest 2 View  Result Date: 01/19/2023 CLINICAL DATA:  Provided history: Right pleuritic pain with inspiration and eating. EXAM: CHEST - 2 VIEW COMPARISON:  None. FINDINGS: Heart size within normal limits. No appreciable airspace consolidation. No evidence of pleural effusion or pneumothorax. No acute osseous abnormality identified. Dextrocurvature and spondylosis of the thoracic spine. IMPRESSION: No evidence of  active cardiopulmonary disease. Electronically Signed   By: Jackey Loge D.O.   On: 01/19/2023 12:48    Procedures Procedures (including critical care time)  Medications Ordered in UC  Medications - No data to display  Initial Impression / Assessment and Plan / UC Course  I have reviewed the triage vital signs and the nursing notes.  Pertinent labs & imaging results that were available during my care of the patient were reviewed by me and considered in my medical decision making (see chart for details).     Patient has looked up his symptoms and has concern for cholecystitis.  I explained to him that it would be unlikely for cholecystitis to hurt when not related to meals, upon deep breath.  Discussed possible pleurisy or pleuritic chest pain. I explained the patient's test results to him.  I think he has some pleuritic chest pain.  Will treat with anti-inflammatories.  Return if worse Final Clinical Impressions(s) / UC Diagnoses   Final diagnoses:  Pleuritic chest pain     Discharge Instructions      Take ibuprofen 3 times a day.  It is important you take this with food  Make an appointment with a new primary care doctor for follow-up     ED Prescriptions     Medication Sig Dispense Auth. Provider   ibuprofen (ADVIL) 800 MG tablet Take 1 tablet (800 mg total) by mouth 3 (three) times daily. 21 tablet Eustace Moore, MD      PDMP not reviewed this encounter.   Eustace Moore, MD 01/19/23 1325

## 2023-01-19 NOTE — Discharge Instructions (Signed)
Take ibuprofen 3 times a day.  It is important you take this with food  Make an appointment with a new primary care doctor for follow-up

## 2023-01-26 ENCOUNTER — Ambulatory Visit (INDEPENDENT_AMBULATORY_CARE_PROVIDER_SITE_OTHER): Payer: 59 | Admitting: Family Medicine

## 2023-01-26 ENCOUNTER — Encounter: Payer: Self-pay | Admitting: Family Medicine

## 2023-01-26 VITALS — BP 150/88 | HR 71 | Temp 97.9°F | Ht 77.5 in | Wt 356.8 lb

## 2023-01-26 DIAGNOSIS — F411 Generalized anxiety disorder: Secondary | ICD-10-CM

## 2023-01-26 DIAGNOSIS — G4700 Insomnia, unspecified: Secondary | ICD-10-CM | POA: Diagnosis not present

## 2023-01-26 DIAGNOSIS — I10 Essential (primary) hypertension: Secondary | ICD-10-CM | POA: Diagnosis not present

## 2023-01-26 DIAGNOSIS — Z7689 Persons encountering health services in other specified circumstances: Secondary | ICD-10-CM | POA: Insufficient documentation

## 2023-01-26 DIAGNOSIS — R109 Unspecified abdominal pain: Secondary | ICD-10-CM | POA: Diagnosis not present

## 2023-01-26 DIAGNOSIS — N35919 Unspecified urethral stricture, male, unspecified site: Secondary | ICD-10-CM | POA: Insufficient documentation

## 2023-01-26 DIAGNOSIS — Z13228 Encounter for screening for other metabolic disorders: Secondary | ICD-10-CM | POA: Insufficient documentation

## 2023-01-26 NOTE — Progress Notes (Signed)
New Patient Office Visit  Subjective    Patient ID: Corey Mayo, male    DOB: 1971/04/04  Age: 52 y.o. MRN: [id number]  CC:  Chief Complaint  Patient presents with   Establish Care    Pt here to establish new care, pt only concern is every time he eat he stated he feel pressure in the middle of his stomach     HPI Corey Mayo presents to establish care with this practice. He is new to me.   GAD: sertraline 100 mg daily is controlling your symptoms. Denies thoughts of self harm.   HTN: amlodipine 5 mg daily, has not been checking BP at home. Been on  medication since July 2023 per cardiology.   RUQ pain: went to Urgent care, EKG and chest x-ray done. Ultrasound negative for gall stones. Positive fatty liver. 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 Continues to hurt some in epigastric area, intermittently. No nausea or vomiting or fever.   Insomnia: takes Delta 8 gummies, which helps.  Urethral stricture: has seen urology is past. Will place referral today.   Chart review: 01/30/22: stress test per cards, no chest pain, no ST elevation. 02/04/22: Normal ECG, sinus rhythm.  01/19/23: urgent care visit: RUQ pain with eating, ultrasound without gall stones, fatty liver. Blood pressure elevated.      Outpatient Encounter Medications as of 01/26/2023  Medication Sig   amLODipine (NORVASC) 5 MG tablet Take 5 mg by mouth daily.   ibuprofen (ADVIL) 800 MG tablet Take 1 tablet (800 mg total) by mouth 3 (three) times daily.   sertraline (ZOLOFT) 100 MG tablet Take 1 tablet (100 mg total) by mouth daily.   No facility-administered encounter medications on file as of 01/26/2023.    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   Urethral stricture     Past Surgical History:  Procedure Laterality Date   Partial  Phalangectomy Left 10/24/2016   Left #3 toe    Family History  Adopted: Yes  Problem Relation Age of Onset   COPD Mother    Heart attack Father     Social History   Socioeconomic History   Marital status: Single    Spouse name: Not on file   Number of children: Not on file   Years of education: Not on file   Highest education level: Not on file  Occupational History   Not on file  Tobacco Use   Smoking status: Former    Types: Cigarettes    Quit date: 08/16/2012    Years since quitting: 10.4   Smokeless tobacco: Never  Vaping Use   Vaping Use: Never used  Substance and Sexual Activity   Alcohol use: Yes    Alcohol/week: 2.0 standard drinks of alcohol    Types: 2 Standard drinks or equivalent per week    Comment: weekly   Drug use: Yes    Frequency: 2.0 times per week    Types: Marijuana   Sexual activity: Not on file  Other Topics Concern   Not on file  Social History Narrative   Not on file   Social Determinants of Health   Financial Resource Strain: Not on file  Food Insecurity: Not on file  Transportation Needs: Not on file  Physical Activity: Not on file  Stress: Not on  file  Social Connections: Not on file  Intimate Partner Violence: Not on file    Review of Systems  Constitutional:  Negative for chills, fever and malaise/fatigue.  Eyes:  Negative for blurred vision and double vision.  Respiratory:  Negative for shortness of breath.   Cardiovascular:  Negative for chest pain.  Gastrointestinal:  Positive for abdominal pain (ruq pain resolved after ibuprofen.). Negative for heartburn, nausea and vomiting.  Musculoskeletal:  Positive for back pain (after playing guitar).  Neurological:  Negative for dizziness and headaches.  Endo/Heme/Allergies:  Negative for polydipsia.  Psychiatric/Behavioral:  Negative for depression and suicidal ideas.         Objective    BP (!) 150/88 (BP Location: Right Arm, Patient Position: Sitting, Cuff Size: Large)    Pulse 71   Temp 97.9 F (36.6 C) (Oral)   Ht 6' 5.5" (1.969 m)   Wt (!) 356 lb 12.8 oz (161.8 kg)   SpO2 97%   BMI 41.77 kg/m   Physical Exam Vitals and nursing note reviewed.  Constitutional:      Appearance: Normal appearance. He is obese.  Cardiovascular:     Rate and Rhythm: Regular rhythm.     Heart sounds: Normal heart sounds.  Pulmonary:     Effort: Pulmonary effort is normal.     Breath sounds: Normal breath sounds.  Abdominal:     General: Bowel sounds are normal.     Palpations: Abdomen is soft.     Tenderness: There is no abdominal tenderness.  Skin:    General: Skin is warm and dry.     Capillary Refill: Capillary refill takes less than 2 seconds.  Neurological:     General: No focal deficit present.     Mental Status: He is alert. Mental status is at baseline.  Psychiatric:        Mood and Affect: Mood normal.        Behavior: Behavior normal.        Thought Content: Thought content normal.        Judgment: Judgment normal.         Assessment & Plan:   Problem List Items Addressed This Visit     GAD (generalized anxiety disorder)  Well controlled on sertraline. Denies thoughts of self harm. Will continue to monitor.    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 Referral placed.    Relevant Orders   Ambulatory referral to Urology   Establishing care with new doctor, encounter for - Primary   Elevated blood pressure reading in office with diagnosis of hypertension  Taking amlodipine 5 mg daily as prescribed. Denies chest pain, shortness of breath, lower extremity edema, vision changes, and headaches. Will monitor blood pressure at home and bring log at follow-up appointment. Will add hydrochlorothiazide at that visit if pressures remain elevated. DASH diet and moderate exercise.    Abdominal pain/fatty liver  Normal abdominal exam in office. Wonders if the discomfort is related to lifting a heavy co-worker from  floor. Ultrasound does not show gall stones. Will continue to monitor. Reports symptoms are intermittent, no nausea or vomiting. Fatty liver, avoids alcohol, recommend avoiding acetaminophen. Does not eat animal protein, is vegetarian.    Insomnia  Well-controlled with Delta 8 gummies. No side effects reported. Tolerates well.   Agrees with plan of care discussed.  Questions answered.   Return in about 4 weeks (around 02/23/2023) for blood pressure.   Novella Olive,  FNP

## 2023-01-26 NOTE — Patient Instructions (Addendum)
Monitor blood pressure 3-4 times per week and log it, bring to next appointment.   Read the [place] Study  Nutritionfacts.org

## 2023-02-11 ENCOUNTER — Ambulatory Visit: Payer: 59 | Admitting: Urology

## 2023-02-11 ENCOUNTER — Encounter: Payer: Self-pay | Admitting: Urology

## 2023-02-11 VITALS — BP 156/92 | HR 67 | Ht 77.0 in | Wt 325.0 lb

## 2023-02-11 DIAGNOSIS — N35919 Unspecified urethral stricture, male, unspecified site: Secondary | ICD-10-CM

## 2023-02-11 LAB — MICROSCOPIC EXAMINATION: Bacteria, UA: NONE SEEN

## 2023-02-11 LAB — URINALYSIS, ROUTINE W REFLEX MICROSCOPIC
Bilirubin, UA: NEGATIVE
Glucose, UA: NEGATIVE
Ketones, UA: NEGATIVE
Leukocytes,UA: NEGATIVE
Nitrite, UA: NEGATIVE
Protein,UA: NEGATIVE
Specific Gravity, UA: 1.025 (ref 1.005–1.030)
Urobilinogen, Ur: 0.2 mg/dL (ref 0.2–1.0)
pH, UA: 6 (ref 5.0–7.5)

## 2023-02-11 LAB — BLADDER SCAN AMB NON-IMAGING

## 2023-02-11 MED ORDER — ALPRAZOLAM 2 MG PO TABS: 2.0000 mg | ORAL_TABLET | Freq: Once | ORAL | 0 refills | Status: AC

## 2023-02-11 NOTE — Progress Notes (Signed)
   Assessment: 1. Stricture of male urethra, unspecified stricture type      Plan: Schedule cystoscopy to evaluate urethral stricture disease Rx: Xanax 2 mg prior to procedure for sedation per patient request  Chief Complaint: Urethral stricture disease  History of Present Illness:  Corey Mayo is a 52 y.o. male with a past medical history of generalized anxiety disorder, hypertension, dyspnea on exertion followed by cardiology who is seen in consultation from Novella Olive, FNP for evaluation of urethral stricture.  Patient has previously been under the care of of Dr.Puschinsky in [facility].  He reports having had urethral dilation performed on 2 occasions the last time was approximately 4 years ago.  He states that he improved significantly after each dilation but notes that over the last year or 2 he has had progressive worsening lower urinary tract symptoms. Current IPSS = 23.  Most predominant symptoms are weak stream and need to strain to void.  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   Urethral stricture     Past Surgical History:  Past Surgical History:  Procedure Laterality Date   Partial Phalangectomy Left 10/24/2016   Left #3 toe    Allergies:  Mayo Known Allergies  Family History:  Family History  Adopted: Yes  Problem Relation Age of Onset   COPD Mother    Heart attack Father     Social History:  Social History   Tobacco Use   Smoking status: Former    Current packs/day: 0.00    Types: Cigarettes    Quit date: 08/16/2012    Years since quitting: 10.4   Smokeless tobacco: Never  Vaping Use   Vaping status: Never Used  Substance Use Topics   Alcohol use: Yes    Alcohol/week: 2.0 standard drinks of alcohol    Types: 2 Standard drinks or equivalent  per week    Comment: weekly   Drug use: Yes    Frequency: 2.0 times per week    Types: Marijuana    Review of symptoms:  Constitutional:  Negative for unexplained weight loss, night sweats, fever, chills ENT:  Negative for nose bleeds, sinus pain, painful swallowing CV:  Negative for chest pain, shortness of breath, exercise intolerance, palpitations, loss of consciousness Resp:  Negative for cough, wheezing, shortness of breath GI:  Negative for nausea, vomiting, diarrhea, bloody stools GU:  Positives noted in HPI; otherwise negative for gross hematuria, dysuria, urinary incontinence Neuro:  Negative for seizures, poor balance, limb weakness, slurred speech Psych:  Negative for lack of energy, depression, anxiety Endocrine:  Negative for polydipsia, polyuria, symptoms of hypoglycemia (dizziness, hunger, sweating) Hematologic:  Negative for anemia, purpura, petechia, prolonged or excessive bleeding, use of anticoagulants  Allergic:  Negative for difficulty breathing or choking as a result of exposure to anything; Mayo shellfish allergy; Mayo allergic response (rash/itch) to materials, foods  Physical exam: BP (!) 156/92   Pulse 67   Ht 6\' 5"  (1.956 m)   Wt (!) 325 lb (147.4 kg)   BMI 38.54 kg/m  GENERAL APPEARANCE:  OBESE WM, NAD   Results: Urinalysis is negative for significant blood or infection

## 2023-02-23 ENCOUNTER — Ambulatory Visit (INDEPENDENT_AMBULATORY_CARE_PROVIDER_SITE_OTHER): Payer: 59 | Admitting: Family Medicine

## 2023-02-23 ENCOUNTER — Encounter: Payer: Self-pay | Admitting: Family Medicine

## 2023-02-23 VITALS — BP 137/78 | HR 70 | Temp 98.5°F | Resp 18 | Ht 77.0 in | Wt 348.7 lb

## 2023-02-23 DIAGNOSIS — F411 Generalized anxiety disorder: Secondary | ICD-10-CM

## 2023-02-23 DIAGNOSIS — Z1211 Encounter for screening for malignant neoplasm of colon: Secondary | ICD-10-CM

## 2023-02-23 DIAGNOSIS — I10 Essential (primary) hypertension: Secondary | ICD-10-CM

## 2023-02-23 MED ORDER — SERTRALINE HCL 100 MG PO TABS: 100.0000 mg | ORAL_TABLET | Freq: Every day | ORAL | 0 refills | Status: DC

## 2023-02-23 MED ORDER — AMLODIPINE BESYLATE 5 MG PO TABS: 5.0000 mg | ORAL_TABLET | Freq: Every day | ORAL | 0 refills | Status: DC

## 2023-02-23 NOTE — Progress Notes (Signed)
Established Patient Office Visit  Subjective   Patient ID: Corey Mayo, male    DOB: [birth year]  Age: 52 y.o. MRN: [id number]  Chief Complaint  Patient presents with   Follow-up    Patient is here for his 1 month follow up for BP    HPI  Hypertension Medication compliance: taking amlodipine 5 mg as prescribed.  Denies chest pain, shortness of breath, lower extremity edema, vision changes, headaches.  Pertinent lab work: will get CMP today  Monitoring at home: did not get machine yet, it was broken and had to send it back Tolerating medication well: no side effects  Continue current medication regimen: no changes Follow-up: 3 months  Has lost 10 pounds since last visit. Folllowing DASH diet now.   GAD: symptoms are well controlled. PCP will take over refills.    Review of Systems  Eyes:  Negative for blurred vision and double vision.  Respiratory:  Negative for shortness of breath.   Cardiovascular:  Negative for chest pain and leg swelling.  Neurological:  Negative for dizziness and headaches.  Psychiatric/Behavioral:  Negative for depression and suicidal ideas. The patient is not nervous/anxious.        Symptoms are well controlled.       Objective:     BP 137/78   Pulse 70   Temp 98.5 F (36.9 C) (Oral)   Resp 18   Ht 6\' 5"  (1.956 m)   Wt (!) 348 lb 11.2 oz (158.2 kg)   SpO2 96%   BMI 41.35 kg/m  BP Readings from Last 3 Encounters:  02/23/23 137/78  02/11/23 (!) 156/92  01/26/23 (!) 150/88      Physical Exam Vitals and nursing note reviewed.  Constitutional:      General: He is not in acute distress.    Appearance: Normal appearance. He is obese.  Cardiovascular:     Rate and Rhythm: Normal rate.  Pulmonary:     Effort: Pulmonary effort is normal.  Skin:    General: Skin is warm and dry.     Capillary Refill: Capillary refill takes less than 2 seconds.  Neurological:     General: No focal deficit present.     Mental Status: He is alert.  Mental status is at baseline.  Psychiatric:        Mood and Affect: Mood normal.        Behavior: Behavior normal.        Thought Content: Thought content normal.        Judgment: Judgment normal.     No results found for any visits on 02/23/23.    The 10-year ASCVD risk score (Arnett DK, et al., 2019) is: 7.5%    Assessment & Plan:   Problem List Items Addressed This Visit     GAD (generalized anxiety disorder)   Relevant Medications   sertraline (ZOLOFT) 100 MG tablet   Essential hypertension - Primary   Relevant Medications   amLODipine (NORVASC) 5 MG tablet   Other Relevant Orders   Comprehensive metabolic panel   Other Visit Diagnoses     Screening for colon cancer       Relevant Orders   Ambulatory referral to Gastroenterology     Continue working on lifestyle changes, Dash diet and weight loss. Monitor blood pressure once has working monitor at home. Will get CMP today to assess electrolytes, kidney and liver function.  Follow-up in 2 weeks for CPE with labs. 3 months for HTN.  Refills  sent for sertraline and amlodipine.  Agrees with plan of care discussed.  Questions answered.   Return in about 2 weeks (around 03/09/2023) for cpe with labs.    Novella Olive, FNP

## 2023-02-24 ENCOUNTER — Ambulatory Visit: Payer: 59 | Admitting: Urology

## 2023-02-24 VITALS — BP 143/87 | HR 73

## 2023-02-24 DIAGNOSIS — N401 Enlarged prostate with lower urinary tract symptoms: Secondary | ICD-10-CM | POA: Diagnosis not present

## 2023-02-24 DIAGNOSIS — Z125 Encounter for screening for malignant neoplasm of prostate: Secondary | ICD-10-CM

## 2023-02-24 DIAGNOSIS — N35919 Unspecified urethral stricture, male, unspecified site: Secondary | ICD-10-CM | POA: Diagnosis not present

## 2023-02-24 MED ORDER — SILODOSIN 8 MG PO CAPS: 8.0000 mg | ORAL_CAPSULE | Freq: Every day | ORAL | 3 refills | Status: DC

## 2023-02-24 NOTE — Progress Notes (Signed)
Assessment: 1. Stricture of male urethra, unspecified stricture type   2. Benign localized prostatic hyperplasia with lower urinary tract symptoms (LUTS)   3. Prostate cancer screening     Plan: Today I had a long discussion with the patient regarding his lower urinary tract symptoms and urethral stricture disease.  As noted his stricture is not very impressive and I am not sure it is accounting for his worsening lower urinary tract symptoms.  I suspect he may have some BPH/lower urinary tract symptoms as well. At this time I have recommended medical management with an alpha-blocker to see how he responds. Rx: Silodosin 8 mg daily Nature of medication including proper utilization as well as potential adverse events and side effects reviewed including expected retrograde ejaculation.  Patient will return in 3 months for symptom assessment and we will obtain a baseline PSA prior to visit.  Chief Complaint: LUTS/urethral stricture  HPI: Corey Mayo is a 52 y.o. male who presents for continued evaluation of urethral stricture disease. He is here today for planned cystoscopy. Please see my note 02/11/2023 at the time of initial visit for detailed history. Briefly the patient has a history of urethral stricture disease and is required dilation on at least 2 occasions previously.  The last time was approximately 4 years ago.  He has experienced progressive worsening lower urinary tract symptoms over the last 2 years most predominantly a weak stream and straining to void. Baseline status-- IPSS = 23 PVR = 3 mL  Portions of the above documentation were copied from a prior visit for review purposes only.  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   Urethral stricture     PSH: Past Surgical  History:  Procedure Laterality Date   Partial Phalangectomy Left 10/24/2016   Left #3 toe    SH: Social History   Tobacco Use   Smoking status: Former    Current packs/day: 0.00    Types: Cigarettes    Quit date: 08/16/2012    Years since quitting: 10.5   Smokeless tobacco: Never  Vaping Use   Vaping status: Never Used  Substance Use Topics   Alcohol use: Yes    Alcohol/week: 2.0 standard drinks of alcohol    Types: 2 Standard drinks or equivalent per week    Comment: weekly   Drug use: Yes    Frequency: 2.0 times per week    Types: Marijuana    ROS: Constitutional:  Negative for fever, chills, weight loss CV: Negative for chest pain, previous MI, hypertension Respiratory:  Negative for shortness of breath, wheezing, sleep apnea, frequent cough GI:  Negative for nausea, vomiting, bloody stool, GERD  PE: Vitals:   02/24/23 1056  BP: (!) 143/87  Pulse: 73    GENERAL APPEARANCE:  Well appearing, well developed, well nourished, NAD GU:  nl ext genitalia DRE:  nst, prostate 40gm without n/i  Results: UA neg   PROCEDURE: Cystourethroscopy  Indications: Lower urinary tract symptoms/history of urethral stricture disease  Description of procedure: Patient was brought to the procedure room where he was correctly identified.  The procedure was again discussed with the patient and informed consent was obtained.  Preprocedure timeout was performed.  Flexible cystoscopy was subsequently performed.  In the proximal penile urethra there were noted to be 2 concentric rings strictures  that were not very tight to scope passes easily through them. Prostatic urethra revealed lateral lobe enlargement as well as an elevated bladder neck. Bladder was then entered and carefully inspected.  There were no focal mucosal lesions appreciated there was mild bladder trabeculation.  Ureteral openings appeared normal.  Procedure well-tolerated.  No complications.

## 2023-03-10 ENCOUNTER — Encounter: Payer: Self-pay | Admitting: Family Medicine

## 2023-03-10 ENCOUNTER — Ambulatory Visit (INDEPENDENT_AMBULATORY_CARE_PROVIDER_SITE_OTHER): Payer: 59 | Admitting: Family Medicine

## 2023-03-10 VITALS — BP 130/76 | HR 60 | Temp 98.0°F | Resp 18 | Ht 77.0 in | Wt 341.7 lb

## 2023-03-10 DIAGNOSIS — Z136 Encounter for screening for cardiovascular disorders: Secondary | ICD-10-CM | POA: Diagnosis not present

## 2023-03-10 DIAGNOSIS — Z0000 Encounter for general adult medical examination without abnormal findings: Secondary | ICD-10-CM

## 2023-03-10 DIAGNOSIS — Z13228 Encounter for screening for other metabolic disorders: Secondary | ICD-10-CM | POA: Diagnosis not present

## 2023-03-10 DIAGNOSIS — Z6841 Body Mass Index (BMI) 40.0 and over, adult: Secondary | ICD-10-CM | POA: Diagnosis not present

## 2023-03-10 DIAGNOSIS — Z13220 Encounter for screening for lipoid disorders: Secondary | ICD-10-CM | POA: Diagnosis not present

## 2023-03-10 NOTE — Progress Notes (Signed)
Complete physical exam  Patient: Corey Mayo   DOB: Apr 25, 1971   52 y.o. Male  MRN: [id number]  Subjective:    Chief Complaint  Patient presents with   Annual Exam    Patient is here for his annual exam    Corey Mayo is a 52 y.o. male who presents today for a complete physical exam. He reports consuming a general diet.  Going up hills and more active, swimming laps twice per week.   He generally feels well. He reports sleeping fairly well. He does not have additional problems to discuss today.    Most recent fall risk assessment:    01/26/2023    9:51 AM  Fall Risk   Falls in the past year? 0  Number falls in past yr: 0  Injury with Fall? 0  Risk for fall due to : No Fall Risks  Follow up Falls evaluation completed     Most recent depression screenings:    01/26/2023    9:53 AM 02/10/2017    9:34 AM  PHQ 2/9 Scores  PHQ - 2 Score 0 2  PHQ- 9 Score 1   Exception Documentation Medical reason     Vision:Within last year and Dental: No current dental problems and No regular dental care     Patient Care Team: Novella Olive, FNP as PCP - General (Family Medicine)   Outpatient Medications Prior to Visit  Medication Sig   amLODipine (NORVASC) 5 MG tablet Take 1 tablet (5 mg total) by mouth daily.   sertraline (ZOLOFT) 100 MG tablet Take 1 tablet (100 mg total) by mouth daily.   silodosin (RAPAFLO) 8 MG CAPS capsule Take 1 capsule (8 mg total) by mouth daily with breakfast.   No facility-administered medications prior to visit.    Review of Systems  Respiratory:  Negative for shortness of breath.   Cardiovascular:  Negative for chest pain.          Objective:     BP 130/76 (BP Location: Right Arm, Patient Position: Sitting, Cuff Size: Large)   Pulse 60   Temp 98 F (36.7 C) (Oral)   Resp 18   Ht 6\' 5"  (1.956 m)   Wt (!) 341 lb 11.2 oz (155 kg)   SpO2 96%   BMI 40.52 kg/m  BP Readings from Last 3 Encounters:  03/10/23 130/76  02/24/23 (!)  143/87  02/23/23 137/78      Physical Exam Vitals and nursing note reviewed.  Constitutional:      General: He is not in acute distress.    Appearance: Normal appearance.  HENT:     Right Ear: Tympanic membrane normal.     Left Ear: Tympanic membrane normal.     Nose: Nose normal.     Mouth/Throat:     Mouth: Mucous membranes are moist.     Pharynx: Oropharynx is clear.  Eyes:     Extraocular Movements: Extraocular movements intact.  Neck:     Thyroid: No thyroid tenderness.  Cardiovascular:     Rate and Rhythm: Normal rate and regular rhythm.     Pulses:          Radial pulses are 2+ on the right side and 2+ on the left side.     Heart sounds: Normal heart sounds, S1 normal and S2 normal.  Pulmonary:     Effort: Pulmonary effort is normal.     Breath sounds: Normal breath sounds.  Abdominal:  General: Bowel sounds are normal.     Palpations: Abdomen is soft.     Tenderness: There is no abdominal tenderness.  Musculoskeletal:        General: Normal range of motion.     Cervical back: Normal range of motion.     Right lower leg: No edema.     Left lower leg: No edema.  Lymphadenopathy:     Cervical:     Right cervical: No superficial cervical adenopathy.    Left cervical: No superficial cervical adenopathy.  Skin:    General: Skin is warm and dry.  Neurological:     General: No focal deficit present.     Mental Status: He is alert. Mental status is at baseline.  Psychiatric:        Mood and Affect: Mood normal.        Behavior: Behavior normal.        Thought Content: Thought content normal.        Judgment: Judgment normal.     No results found for any visits on 03/10/23. Last metabolic panel Lab Results  Component Value Date   GLUCOSE 89 02/23/2023   NA 143 02/23/2023   K 4.7 02/23/2023   CL 107 (H) 02/23/2023   CO2 21 02/23/2023   BUN 9 02/23/2023   CREATININE 0.79 02/23/2023   EGFR 109 02/23/2023   CALCIUM 8.7 02/23/2023   PROT 6.6 02/23/2023    ALBUMIN 4.2 02/23/2023   LABGLOB 2.4 02/23/2023   BILITOT 0.3 02/23/2023   ALKPHOS 87 02/23/2023   AST 18 02/23/2023   ALT 22 02/23/2023        Assessment & Plan:    Routine Health Maintenance and Physical Exam  Immunization History  Administered Date(s) Administered   Tdap 07/21/2008, 07/13/2016, 11/29/2021    Health Maintenance  Topic Date Due   Colonoscopy  Never done   INFLUENZA VACCINE  03/23/2023 (Originally 02/19/2023)   COVID-19 Vaccine (1 - 2023-24 season) 03/30/2023 (Originally 03/21/2022)   DTaP/Tdap/Td (4 - Td or Tdap) 11/30/2031   Hepatitis C Screening  Completed   HIV Screening  Completed   HPV VACCINES  Aged Out    Discussed health benefits of physical activity, and encouraged him to engage in regular exercise appropriate for his age and condition.  Annual physical exam -     CBC with Differential/Platelet  Encounter for lipid screening for cardiovascular disease -     Lipid panel  Encounter for screening for metabolic disorder -     Hemoglobin A1c -     TSH  BMI 40.0-44.9, adult (HCC) -     Hemoglobin A1c -     TSH      Routine labs ordered. CMP done 02/23/23: normal.  HCM reviewed/discussed. GI referral placed on 03/05/23 for colonoscopy. Has lost 7 pounds since last visit. Goal weight by end of year: 300 pounds. Anticipatory guidance regarding healthy weight, lifestyle and choices given. Recommend healthy diet.  Recommend approximately 150 minutes/week of moderate intensity exercise. Resistance training is good for building muscles and for bone health. Muscle mass helps to increase our metabolism and to burn more calories at rest.  Limit alcohol consumption: no more than one drink per day for women and 2 drinks per day for me. Recommend regular dental and vision exams. Always use seatbelt/lap and shoulder restraints. Recommend using smoke alarms and checking batteries at least twice a year. Recommend using sunscreen when outside.  Please know  that I am here to  help you with all of your health care goals and am happy to work with you to find a solution that works best for you.  The greatest advice I have received with any changes in life are to take it one step at a time, that even means if all you can focus on is the next 60 seconds, then do that and celebrate your victories.  With any changes in life, you will have set backs, and that is OK. The important thing to remember is, if you have a set back, it is not a failure, it is an opportunity to try again! Agrees with plan of care discussed.  Questions answered.      Return in about 2 months (around 05/25/2023) for HTN and GAD.     Novella Olive, FNP

## 2023-03-11 LAB — CBC WITH DIFFERENTIAL/PLATELET
Basophils Absolute: 0.1 10*3/uL (ref 0.0–0.2)
Basos: 1 %
EOS (ABSOLUTE): 0.4 10*3/uL (ref 0.0–0.4)
Eos: 6 %
Hematocrit: 41.7 % (ref 37.5–51.0)
Hemoglobin: 14.1 g/dL (ref 13.0–17.7)
Immature Grans (Abs): 0 10*3/uL (ref 0.0–0.1)
Immature Granulocytes: 0 %
Lymphocytes Absolute: 2.4 10*3/uL (ref 0.7–3.1)
Lymphs: 35 %
MCH: 28.3 pg (ref 26.6–33.0)
MCHC: 33.8 g/dL (ref 31.5–35.7)
MCV: 84 fL (ref 79–97)
Monocytes Absolute: 0.5 10*3/uL (ref 0.1–0.9)
Monocytes: 7 %
Neutrophils Absolute: 3.6 10*3/uL (ref 1.4–7.0)
Neutrophils: 51 %
Platelets: 324 10*3/uL (ref 150–450)
RBC: 4.98 x10E6/uL (ref 4.14–5.80)
RDW: 13.2 % (ref 11.6–15.4)
WBC: 6.9 10*3/uL (ref 3.4–10.8)

## 2023-03-11 LAB — LIPID PANEL
Chol/HDL Ratio: 7.7 ratio — ABNORMAL HIGH (ref 0.0–5.0)
Cholesterol, Total: 239 mg/dL — ABNORMAL HIGH (ref 100–199)
HDL: 31 mg/dL — ABNORMAL LOW (ref >39)
LDL Chol Calc (NIH): 159 mg/dL — ABNORMAL HIGH (ref 0–99)
Triglycerides: 260 mg/dL — ABNORMAL HIGH (ref 0–149)
VLDL Cholesterol Cal: 49 mg/dL — ABNORMAL HIGH (ref 5–40)

## 2023-03-11 LAB — HEMOGLOBIN A1C
Est. average glucose Bld gHb Est-mCnc: 108 mg/dL
Hgb A1c MFr Bld: 5.4 % (ref 4.8–5.6)

## 2023-03-11 LAB — TSH: TSH: 7.26 u[IU]/mL — ABNORMAL HIGH (ref 0.450–4.500)

## 2023-04-02 DIAGNOSIS — R0609 Other forms of dyspnea: Secondary | ICD-10-CM | POA: Diagnosis not present

## 2023-04-02 DIAGNOSIS — I10 Essential (primary) hypertension: Secondary | ICD-10-CM | POA: Diagnosis not present

## 2023-05-04 ENCOUNTER — Ambulatory Visit (INDEPENDENT_AMBULATORY_CARE_PROVIDER_SITE_OTHER): Payer: 59 | Admitting: Family Medicine

## 2023-05-04 ENCOUNTER — Encounter: Payer: Self-pay | Admitting: Family Medicine

## 2023-05-04 VITALS — BP 140/86 | HR 74 | Temp 97.7°F | Ht 77.0 in | Wt 336.0 lb

## 2023-05-04 DIAGNOSIS — K2900 Acute gastritis without bleeding: Secondary | ICD-10-CM | POA: Diagnosis not present

## 2023-05-04 DIAGNOSIS — I10 Essential (primary) hypertension: Secondary | ICD-10-CM | POA: Diagnosis not present

## 2023-05-04 DIAGNOSIS — R030 Elevated blood-pressure reading, without diagnosis of hypertension: Secondary | ICD-10-CM | POA: Insufficient documentation

## 2023-05-04 NOTE — Progress Notes (Signed)
Established Patient Office Visit  Subjective   Patient ID: Corey Mayo, male    DOB: Mar 12, 1971  Age: 52 y.o. MRN: [id number]  Chief Complaint  Patient presents with   Nausea    Possible food poisoning, pt states he ate at [employer] and had some nausea and vomiting and diarrhea over the last couple of days and having some lower rt inguinal pain that travels to the back, pt states he's been on a BRAT diet and has had some ease of symptoms  and want to check and make sure he does not have diverticulitis or listeria.       HPI  Presents today for an acute visit with complaint of nausea and vomiting with abdominal pain after eating at [employer] on Thursday. No history of diverticulitis. This has happened once before. Currently feeling tired and back soreness. No vomiting. Symptoms have improved since onset.  Symptoms have been present  4 days, symptoms have improved.  Associated symptoms include: fever on Thursday night and Friday. Did not check unsure of T-max.  Pertinent negatives: no LLQ pain  Pain severity: 0/10  Treatments tried include : bland diet and tolerating liquids well.  Treatment effective : improving since Friday.  Sick contacts : unsure Able to drink and eating bland diet, tolerated well.   Elevated blood pressure: on amlodipine 5 mg.  Making lifestyle changes.     Review of Systems  Constitutional:  Negative for chills and fever.  Respiratory:  Negative for shortness of breath.   Cardiovascular:  Negative for chest pain and leg swelling.  Gastrointestinal:  Positive for abdominal pain, nausea and vomiting.       Vomited x 3-4 times after eating food from [employer].   Genitourinary:  Negative for dysuria, frequency and urgency.  Musculoskeletal:  Positive for back pain (improving).  Neurological:  Negative for headaches.      Objective:     BP (!) 140/86 (BP Location: Right Arm, Patient Position: Sitting, Cuff Size: Large)   Pulse 74   Temp 97.7 F  (36.5 C) (Oral)   Ht 6\' 5"  (1.956 m)   Wt (!) 336 lb (152.4 kg)   SpO2 99%   BMI 39.84 kg/m  BP Readings from Last 3 Encounters:  05/04/23 (!) 140/86  03/10/23 130/76  02/24/23 (!) 143/87      Physical Exam Vitals and nursing note reviewed.  Constitutional:      General: He is not in acute distress.    Appearance: Normal appearance. He is not ill-appearing.  Cardiovascular:     Rate and Rhythm: Regular rhythm.     Heart sounds: Normal heart sounds.  Pulmonary:     Effort: Pulmonary effort is normal.     Breath sounds: Normal breath sounds.  Abdominal:     General: Bowel sounds are normal.     Palpations: Abdomen is soft.     Tenderness: There is no abdominal tenderness. There is no guarding or rebound.  Skin:    General: Skin is warm and dry.  Neurological:     General: No focal deficit present.     Mental Status: He is alert. Mental status is at baseline.  Psychiatric:        Mood and Affect: Mood normal.        Behavior: Behavior normal.        Thought Content: Thought content normal.        Judgment: Judgment normal.     No results found  for any visits on 05/04/23.    The 10-year ASCVD risk score (Arnett DK, et al., 2019) is: 9.2%    Assessment & Plan:   Problem List Items Addressed This Visit     Essential hypertension    BP improved upon recheck. Taking amlodipine 5 mg as prescribed. Denies chest pain, shortness of breath, lower extremity edema, vision changes, and headaches. Return in 3 weeks for medication management, expect blood pressure to be improved once he is feeling better from this current illness. Is making lifestyle changes and has lost some weight. No refills needed.       Acute gastritis without hemorrhage - Primary    Nausea and vomiting after eating at [employer]. Symptoms have improved and tolerating bland diet and liquids. No history of diverticulitis, no LLQ tenderness upon palpation. No fever. He is due for colonoscopy, will call GI to  make this appointment. Continue diet as tolerated. Symptoms reviewed that warrant seeking higher level of care.      Agrees with plan of care discussed.  Questions answered. Will call GI to schedule colonoscopy.   Return if symptoms worsen or fail to improve.    Novella Olive, FNP

## 2023-05-04 NOTE — Assessment & Plan Note (Addendum)
BP improved upon recheck. Taking amlodipine 5 mg as prescribed. Denies chest pain, shortness of breath, lower extremity edema, vision changes, and headaches. Return in 3 weeks for medication management, expect blood pressure to be improved once he is feeling better from this current illness. Is making lifestyle changes and has lost some weight. No refills needed.

## 2023-05-04 NOTE — Assessment & Plan Note (Signed)
BP improved upon recheck. Denies chest pain, shortness of breath, lower extremity edema, vision changes, and headaches. Return in 2 weeks for blood pressure check. If continues to be elevated, will consider treatment. Is making lifestyle changes and has lost some weight.

## 2023-05-04 NOTE — Assessment & Plan Note (Signed)
Nausea and vomiting after eating at [facility]. Symptoms have improved and tolerating bland diet and liquids. No history of diverticulitis, no LLQ tenderness upon palpation. No fever. He is due for colonoscopy, will call GI to make this appointment. Continue diet as tolerated. Symptoms reviewed that warrant seeking higher level of care.

## 2023-05-13 ENCOUNTER — Other Ambulatory Visit: Payer: Self-pay | Admitting: Family Medicine

## 2023-05-13 DIAGNOSIS — I10 Essential (primary) hypertension: Secondary | ICD-10-CM

## 2023-05-18 ENCOUNTER — Ambulatory Visit (AMBULATORY_SURGERY_CENTER): Payer: 59 | Admitting: *Deleted

## 2023-05-18 VITALS — Ht 77.0 in | Wt 331.0 lb

## 2023-05-18 DIAGNOSIS — Z1211 Encounter for screening for malignant neoplasm of colon: Secondary | ICD-10-CM

## 2023-05-18 MED ORDER — NA SULFATE-K SULFATE-MG SULF 17.5-3.13-1.6 GM/177ML PO SOLN
1.0000 | Freq: Once | ORAL | 0 refills | Status: AC
Start: 2023-05-18 — End: 2023-05-18

## 2023-05-18 NOTE — Progress Notes (Signed)
Pt's name and DOB verified at the beginning of the pre-visit wit 2 identifiers  Pt denies any difficulty with ambulating,sitting, laying down or rolling side to side  Gave both [facility] main # and MD on call # prior to instructions.   No egg or soy allergy known to patient   No issues known to pt with past sedation with any surgeries or procedures  Pt denies having issues being intubated  Pt has no issues moving head neck or swallowing  No FH of Malignant Hyperthermia  Pt is not on diet pills or shots  Pt is not on home 02   Pt is not on blood thinners   Pt denies issues with constipation   Pt is not on dialysis  Pt denise any abnormal heart rhythms   Pt denies any upcoming cardiac testing  Pt encouraged to use to use Singlecare or Goodrx to reduce cost   Patient's chart reviewed by Cathlyn Parsons CNRA prior to pre-visit and patient appropriate for the [facility].  Pre-visit completed and red dot placed by patient's name on their procedure day (on provider's schedule).  .  Visit by phone  Pt states weight is 331 lb  Instructed pt why it is important to and  to call if they have any changes in health or new medications. Directed them to the # given and on instructions.     Instructions reviewed with pt and pt states understanding. Instructed to review again prior to procedure. Pt states they will.   Instructions sent by mail with coupon and by my chart  Instructions given to not smoke  Marijuana day before and day of procedure

## 2023-05-19 ENCOUNTER — Other Ambulatory Visit: Payer: 59

## 2023-05-19 DIAGNOSIS — Z125 Encounter for screening for malignant neoplasm of prostate: Secondary | ICD-10-CM

## 2023-05-20 LAB — PSA: Prostate Specific Ag, Serum: 1.7 ng/mL (ref 0.0–4.0)

## 2023-05-25 ENCOUNTER — Ambulatory Visit: Payer: 59 | Admitting: Family Medicine

## 2023-05-26 ENCOUNTER — Encounter: Payer: Self-pay | Admitting: Gastroenterology

## 2023-05-27 ENCOUNTER — Ambulatory Visit: Payer: 59 | Admitting: Urology

## 2023-06-04 ENCOUNTER — Ambulatory Visit (AMBULATORY_SURGERY_CENTER): Payer: 59 | Admitting: Gastroenterology

## 2023-06-04 ENCOUNTER — Encounter: Payer: Self-pay | Admitting: Gastroenterology

## 2023-06-04 VITALS — BP 131/81 | HR 55 | Temp 99.0°F | Resp 12 | Ht 77.0 in | Wt 331.0 lb

## 2023-06-04 DIAGNOSIS — Z1211 Encounter for screening for malignant neoplasm of colon: Secondary | ICD-10-CM

## 2023-06-04 DIAGNOSIS — D124 Benign neoplasm of descending colon: Secondary | ICD-10-CM | POA: Diagnosis not present

## 2023-06-04 DIAGNOSIS — D125 Benign neoplasm of sigmoid colon: Secondary | ICD-10-CM | POA: Diagnosis not present

## 2023-06-04 DIAGNOSIS — K635 Polyp of colon: Secondary | ICD-10-CM | POA: Diagnosis not present

## 2023-06-04 MED ORDER — SODIUM CHLORIDE 0.9 % IV SOLN: 500.0000 mL | Freq: Once | INTRAVENOUS | Status: DC

## 2023-06-04 MED ORDER — SODIUM CHLORIDE 0.9 % IV SOLN: 500.0000 mL | INTRAVENOUS | Status: DC

## 2023-06-04 NOTE — Progress Notes (Signed)
A/O x 3, gd SR's, VSS, report to RN

## 2023-06-04 NOTE — Op Note (Signed)
[facility] Patient Name: Corey Mayo Procedure Date: 06/04/2023 10:04 AM MRN: [id number] Endoscopist: Meryl Dare , MD, [phone] Age: 52 Referring MD:  Date of Birth: 14-Feb-1971 Gender: Male Account #: [id number] Procedure:                Colonoscopy Indications:              Screening for colorectal malignant neoplasm Medicines:                Monitored Anesthesia Care Procedure:                Pre-Anesthesia Assessment:                           - Prior to the procedure, a History and Physical                            was performed, and patient medications and                            allergies were reviewed. The patient's tolerance of                            previous anesthesia was also reviewed. The risks                            and benefits of the procedure and the sedation                            options and risks were discussed with the patient.                            All questions were answered, and informed consent                            was obtained. Prior Anticoagulants: The patient has                            taken no anticoagulant or antiplatelet agents. ASA                            Grade Assessment: II - A patient with mild systemic                            disease. After reviewing the risks and benefits,                            the patient was deemed in satisfactory condition to                            undergo the procedure.                           After obtaining informed consent, the colonoscope  was passed under direct vision. Throughout the                            procedure, the patient's blood pressure, pulse, and                            oxygen saturations were monitored continuously. The                            Olympus Scope [id number] was introduced through the                            anus and advanced to the the cecum, identified by                             appendiceal orifice and ileocecal valve. The                            ileocecal valve, appendiceal orifice, and rectum                            were photographed. The quality of the bowel                            preparation was excellent. The colonoscopy was                            performed without difficulty. The patient tolerated                            the procedure well. Scope In: 10:09:22 AM Scope Out: 10:25:44 AM Scope Withdrawal Time: 0 hours 13 minutes 12 seconds  Total Procedure Duration: 0 hours 16 minutes 22 seconds  Findings:                 The perianal and digital rectal examinations were                            normal.                           Five sessile polyps were found in the sigmoid colon                            (4) and descending colon (1). The polyps were 4 to                            6 mm in size. These polyps were removed with a cold                            snare. Resection and retrieval were complete.                           Internal hemorrhoids were found during  retroflexion. The hemorrhoids were small and Grade                            I (internal hemorrhoids that do not prolapse).                           The exam was otherwise without abnormality on                            direct and retroflexion views. Complications:            No immediate complications. Estimated blood loss:                            None. Estimated Blood Loss:     Estimated blood loss: none. Impression:               - Five 4 to 6 mm polyps in the sigmoid colon and in                            the descending colon, removed with a cold snare.                            Resected and retrieved.                           - Internal hemorrhoids.                           - The examination was otherwise normal on direct                            and retroflexion views. Recommendation:           - Repeat colonoscopy after studies  are complete for                            surveillance based on pathology results.                           - Patient has a contact number available for                            emergencies. The signs and symptoms of potential                            delayed complications were discussed with the                            patient. Return to normal activities tomorrow.                            Written discharge instructions were provided to the                            patient.                           -  Resume previous diet.                           - Continue present medications.                           - Await pathology results. Meryl Dare, MD 06/04/2023 10:28:40 AM This report has been signed electronically.

## 2023-06-04 NOTE — Progress Notes (Signed)
Pt's states no medical or surgical changes since previsit or office visit. 

## 2023-06-04 NOTE — Progress Notes (Signed)
See 05/04/2023 H&P no changes

## 2023-06-04 NOTE — Patient Instructions (Signed)
Educational handout provided to patient related to Hemorrhoids & Polyps  Resume previous diet  Continue present medications  Awaiting pathology results   YOU HAD AN ENDOSCOPIC PROCEDURE TODAY AT THE  ENDOSCOPY CENTER:   Refer to the procedure report that was given to you for any specific questions about what was found during the examination.  If the procedure report does not answer your questions, please call your gastroenterologist to clarify.  If you requested that your care partner not be given the details of your procedure findings, then the procedure report has been included in a sealed envelope for you to review at your convenience later.  YOU SHOULD EXPECT: Some feelings of bloating in the abdomen. Passage of more gas than usual.  Walking can help get rid of the air that was put into your GI tract during the procedure and reduce the bloating. If you had a lower endoscopy (such as a colonoscopy or flexible sigmoidoscopy) you may notice spotting of blood in your stool or on the toilet paper. If you underwent a bowel prep for your procedure, you may not have a normal bowel movement for a few days.  Please Note:  You might notice some irritation and congestion in your nose or some drainage.  This is from the oxygen used during your procedure.  There is no need for concern and it should clear up in a day or so.  SYMPTOMS TO REPORT IMMEDIATELY:  Following lower endoscopy (colonoscopy or flexible sigmoidoscopy):  Excessive amounts of blood in the stool  Significant tenderness or worsening of abdominal pains  Swelling of the abdomen that is new, acute  Fever of 100F or higher   For urgent or emergent issues, a gastroenterologist can be reached at any hour by calling [phone]. Do not use MyChart messaging for urgent concerns.    DIET:  We do recommend a small meal at first, but then you may proceed to your regular diet.  Drink plenty of fluids but you should avoid alcoholic  beverages for 24 hours.  ACTIVITY:  You should plan to take it easy for the rest of today and you should NOT DRIVE or use heavy machinery until tomorrow (because of the sedation medicines used during the test).    FOLLOW UP: Our staff will call the number listed on your records the next business day following your procedure.  We will call around 7:15- 8:00 am to check on you and address any questions or concerns that you may have regarding the information given to you following your procedure. If we do not reach you, we will leave a message.     If any biopsies were taken you will be contacted by phone or by letter within the next 1-3 weeks.  Please call us at [phone] if you have not heard about the biopsies in 3 weeks.    SIGNATURES/CONFIDENTIALITY: You and/or your care partner have signed paperwork which will be entered into your electronic medical record.  These signatures attest to the fact that that the information above on your After Visit Summary has been reviewed and is understood.  Full responsibility of the confidentiality of this discharge information lies with you and/or your care-partner.

## 2023-06-05 ENCOUNTER — Telehealth: Payer: Self-pay | Admitting: *Deleted

## 2023-06-05 NOTE — Telephone Encounter (Signed)
Mailbox full on f/u call  

## 2023-06-08 LAB — SURGICAL PATHOLOGY

## 2023-06-11 ENCOUNTER — Ambulatory Visit (INDEPENDENT_AMBULATORY_CARE_PROVIDER_SITE_OTHER): Payer: 59 | Admitting: Family Medicine

## 2023-06-11 VITALS — BP 137/79 | HR 57 | Temp 97.6°F | Resp 18 | Ht 77.0 in | Wt 345.1 lb

## 2023-06-11 DIAGNOSIS — F411 Generalized anxiety disorder: Secondary | ICD-10-CM | POA: Diagnosis not present

## 2023-06-11 DIAGNOSIS — R7989 Other specified abnormal findings of blood chemistry: Secondary | ICD-10-CM | POA: Diagnosis not present

## 2023-06-11 DIAGNOSIS — I10 Essential (primary) hypertension: Secondary | ICD-10-CM

## 2023-06-11 MED ORDER — SERTRALINE HCL 100 MG PO TABS: 100.0000 mg | ORAL_TABLET | Freq: Every day | ORAL | 0 refills | Status: DC

## 2023-06-11 NOTE — Assessment & Plan Note (Signed)
Taking sertraline 100 mg daily as prescribed. Symptoms are usually controlled, experienced some stress related to recent election results, this is improving. No thoughts of self-harm. Will continue current regimen. Follow-up in 3 months.

## 2023-06-11 NOTE — Progress Notes (Signed)
Established Patient Office Visit  Subjective   Patient ID: Corey Mayo, male    DOB: 1970/09/23  Age: 52 y.o. MRN: [id number]  Chief Complaint  Patient presents with   Medical Management of Chronic Issues    Patient is here for a follow up for HTN and anxiety    HPI  Hypertension Medication compliance: Taking amlodipine 5 mg daily as prescribed.  Denies chest pain, shortness of breath, lower extremity edema, vision changes, headaches.  Pertinent lab work: CMP  Monitoring at home: 130's/70's Tolerating medication well: no side effects reported  Continue current medication regimen: no changes Follow-up: 3 months   Medication compliance: Taking sertraline 100 mg daily as prescribed.  Symptoms controlled mostly.  Denies thoughts of suicide, insomnia, poor appetite.  Tolerating medication: no side effects  Continue current regimen: no changes  Follow-up: 3 months   Elevated TSH: check TSH, T4 today.     Review of Systems  Eyes:  Negative for blurred vision and double vision.  Respiratory:  Negative for shortness of breath.   Cardiovascular:  Negative for chest pain and leg swelling.  Neurological:  Negative for headaches.  Psychiatric/Behavioral:  Negative for depression and suicidal ideas. The patient is nervous/anxious (improving).       Objective:     BP 137/79   Pulse (!) 57   Temp 97.6 F (36.4 C) (Oral)   Resp 18   Ht 6\' 5"  (1.956 m)   Wt (!) 345 lb 1.6 oz (156.5 kg)   SpO2 96%   BMI 40.92 kg/m  BP Readings from Last 3 Encounters:  06/11/23 137/79  06/04/23 131/81  05/04/23 (!) 140/86      Physical Exam Vitals and nursing note reviewed.  Constitutional:      General: He is not in acute distress.    Appearance: Normal appearance. He is obese.  Cardiovascular:     Rate and Rhythm: Regular rhythm.     Heart sounds: Normal heart sounds.  Pulmonary:     Effort: Pulmonary effort is normal.     Breath sounds: Normal breath sounds.  Skin:     General: Skin is warm and dry.  Neurological:     General: No focal deficit present.     Mental Status: He is alert. Mental status is at baseline.  Psychiatric:        Mood and Affect: Mood normal.        Behavior: Behavior normal.        Thought Content: Thought content normal.        Judgment: Judgment normal.       06/11/2023   10:34 AM 01/26/2023    9:54 AM 02/10/2017    9:52 AM  GAD 7 : Generalized Anxiety Score  Nervous, Anxious, on Edge 2 0 1  Control/stop worrying 2 0 0  Worry too much - different things 0 1 0  Trouble relaxing 1 0 0  Restless 0 0 0  Easily annoyed or irritable 0 0 1  Afraid - awful might happen 2 0 0  Total GAD 7 Score 7 1 2   Anxiety Difficulty Not difficult at all Not difficult at all Not difficult at all  Believes score is related to recent election results.    No results found for any visits on 06/11/23.    The 10-year ASCVD risk score (Arnett DK, et al., 2019) is: 8.9%    Assessment & Plan:   Problem List Items Addressed This Visit  GAD (generalized anxiety disorder)    Taking sertraline 100 mg daily as prescribed. Symptoms are usually controlled, experienced some stress related to recent election results, this is improving. No thoughts of self-harm. Will continue current regimen. Follow-up in 3 months.       Relevant Medications   sertraline (ZOLOFT) 100 MG tablet   Essential hypertension - Primary    Taking amlodipine 5 mg daily as prescribed.  Denies chest pain, shortness of breath, lower extremity edema, vision changes, headaches. Home readings 130's/70's. No changes to current regimen. Follow-up in 3 months.       Elevated TSH    Will check TSH, T4 today.       Relevant Orders   TSH + free T4  Agrees with plan of care discussed.  Questions answered.   Return in about 3 months (around 09/11/2023) for HTN GAD .    Novella Olive, FNP

## 2023-06-11 NOTE — Assessment & Plan Note (Signed)
Will check TSH, T4 today.

## 2023-06-11 NOTE — Assessment & Plan Note (Signed)
Taking amlodipine 5 mg daily as prescribed.  Denies chest pain, shortness of breath, lower extremity edema, vision changes, headaches. Home readings 130's/70's. No changes to current regimen. Follow-up in 3 months.

## 2023-06-12 ENCOUNTER — Other Ambulatory Visit: Payer: Self-pay | Admitting: Family Medicine

## 2023-06-12 DIAGNOSIS — E039 Hypothyroidism, unspecified: Secondary | ICD-10-CM | POA: Insufficient documentation

## 2023-06-12 LAB — TSH+FREE T4
Free T4: 0.77 ng/dL — ABNORMAL LOW (ref 0.82–1.77)
TSH: 8.46 u[IU]/mL — ABNORMAL HIGH (ref 0.450–4.500)

## 2023-06-12 MED ORDER — LEVOTHYROXINE SODIUM 112 MCG PO TABS: 112.0000 ug | ORAL_TABLET | Freq: Every day | ORAL | 0 refills | Status: DC

## 2023-06-16 ENCOUNTER — Encounter: Payer: Self-pay | Admitting: Gastroenterology

## 2023-07-09 ENCOUNTER — Other Ambulatory Visit: Payer: Self-pay | Admitting: Family Medicine

## 2023-07-09 DIAGNOSIS — E039 Hypothyroidism, unspecified: Secondary | ICD-10-CM

## 2023-07-12 ENCOUNTER — Other Ambulatory Visit: Payer: Self-pay | Admitting: Family Medicine

## 2023-07-12 DIAGNOSIS — E039 Hypothyroidism, unspecified: Secondary | ICD-10-CM

## 2023-08-14 ENCOUNTER — Other Ambulatory Visit: Payer: Self-pay | Admitting: Family Medicine

## 2023-08-14 DIAGNOSIS — E039 Hypothyroidism, unspecified: Secondary | ICD-10-CM

## 2023-08-16 ENCOUNTER — Encounter: Payer: Self-pay | Admitting: Family Medicine

## 2023-08-18 ENCOUNTER — Ambulatory Visit (INDEPENDENT_AMBULATORY_CARE_PROVIDER_SITE_OTHER): Payer: 59 | Admitting: Family Medicine

## 2023-08-18 VITALS — BP 126/76 | HR 69 | Temp 97.8°F | Resp 18 | Ht 77.0 in | Wt 351.0 lb

## 2023-08-18 DIAGNOSIS — E039 Hypothyroidism, unspecified: Secondary | ICD-10-CM

## 2023-08-18 NOTE — Assessment & Plan Note (Signed)
Taking levothyroxine 112 mcg daily as prescribed.  Denies heart palpitations, any obvious side effects of medication.  This is a new prescription, needs thyroid function retested.  He may need medication adjustment.  TSH, T3-4, T3 ordered today.  Medication refill will be sent tomorrow after lab results are available.  Understands if medication needs to be adjusted, he will need to follow-up in 6 weeks for repeat labs.

## 2023-08-18 NOTE — Progress Notes (Addendum)
   Established Patient Office Visit  Subjective   Patient ID: Corey Mayo, male    DOB: 22-Jun-1971  Age: 53 y.o. MRN: [id number]  Chief Complaint  Patient presents with   Hypothyroidism    Needs TSH checked and refill     HPI  Taking levothyroxine 112 mcg daily as prescribed. Taking 30-60 minutes before food. This is a new prescription. Ordered on 07/12/23, reports starting one week later due to forgetting. He has developed a routine now.  Took last dose yesterday. Needs labs done today.  Has not noticed any changes in how  he feels.  No heart palpitations.   Will get TSH, T4 today. Refill to be sent tomorrow after lab results.     Review of Systems  Constitutional:  Negative for malaise/fatigue.  Cardiovascular:  Negative for palpitations.     Objective:     BP 126/76 (BP Location: Right Arm, Cuff Size: Large)   Pulse 69   Temp 97.8 F (36.6 C) (Oral)   Resp 18   Ht 6\' 5"  (1.956 m)   Wt (!) 351 lb (159.2 kg)   SpO2 94%   BMI 41.62 kg/m  BP Readings from Last 3 Encounters:  08/18/23 126/76  06/11/23 137/79  06/04/23 131/81      Physical Exam Vitals and nursing note reviewed.  Constitutional:      General: He is not in acute distress.    Appearance: Normal appearance. He is obese.  Cardiovascular:     Rate and Rhythm: Normal rate and regular rhythm.     Heart sounds: Normal heart sounds.  Pulmonary:     Effort: Pulmonary effort is normal.     Breath sounds: Normal breath sounds.  Skin:    General: Skin is warm and dry.  Neurological:     General: No focal deficit present.     Mental Status: He is alert. Mental status is at baseline.  Psychiatric:        Mood and Affect: Mood normal.        Behavior: Behavior normal.        Thought Content: Thought content normal.        Judgment: Judgment normal.     No results found for any visits on 08/18/23.  Last thyroid functions Lab Results  Component Value Date   TSH 8.460 (H) 06/11/2023    Ordered today  The 10-year ASCVD risk score (Arnett DK, et al., 2019) is: 8.3%    Assessment & Plan:   Problem List Items Addressed This Visit     Hypothyroidism - Primary   Taking levothyroxine 112 mcg daily as prescribed.  Denies heart palpitations, any obvious side effects of medication.  This is a new prescription, needs thyroid function retested.  He may need medication adjustment.  TSH, T3-4, T3 ordered today.  Medication refill will be sent tomorrow after lab results are available.  Understands if medication needs to be adjusted, he will need to follow-up in 6 weeks for repeat labs.      Relevant Orders   TSH+T4F+T3Free  Agrees with plan of care discussed.  Questions answered.   Return in about 6 weeks (around 09/29/2023) for if med needs to be adjusted. Novella Olive, FNP

## 2023-08-19 ENCOUNTER — Encounter: Payer: Self-pay | Admitting: Family Medicine

## 2023-08-19 ENCOUNTER — Other Ambulatory Visit: Payer: Self-pay | Admitting: Family Medicine

## 2023-08-19 DIAGNOSIS — E039 Hypothyroidism, unspecified: Secondary | ICD-10-CM

## 2023-08-19 MED ORDER — LEVOTHYROXINE SODIUM 112 MCG PO TABS: 112.0000 ug | ORAL_TABLET | Freq: Every day | ORAL | 0 refills | Status: DC

## 2023-08-20 ENCOUNTER — Encounter: Payer: Self-pay | Admitting: Family Medicine

## 2023-08-20 LAB — TSH+T4F+T3FREE
Free T4: 1.21 ng/dL (ref 0.82–1.77)
T3, Free: 2.6 pg/mL (ref 2.0–4.4)
TSH: 2.09 u[IU]/mL (ref 0.450–4.500)

## 2023-09-11 ENCOUNTER — Ambulatory Visit: Payer: 59 | Admitting: Family Medicine

## 2023-09-19 ENCOUNTER — Other Ambulatory Visit: Payer: Self-pay | Admitting: Family Medicine

## 2023-09-19 DIAGNOSIS — F411 Generalized anxiety disorder: Secondary | ICD-10-CM

## 2023-10-30 ENCOUNTER — Ambulatory Visit: Payer: 59 | Admitting: Family Medicine

## 2023-11-04 ENCOUNTER — Ambulatory Visit: Admitting: Family Medicine

## 2023-11-12 ENCOUNTER — Encounter: Payer: Self-pay | Admitting: Family Medicine

## 2023-11-12 ENCOUNTER — Ambulatory Visit (INDEPENDENT_AMBULATORY_CARE_PROVIDER_SITE_OTHER): Admitting: Family Medicine

## 2023-11-12 VITALS — BP 131/75 | HR 71 | Temp 98.6°F | Ht 77.0 in | Wt 334.3 lb

## 2023-11-12 DIAGNOSIS — E039 Hypothyroidism, unspecified: Secondary | ICD-10-CM | POA: Diagnosis not present

## 2023-11-12 DIAGNOSIS — I10 Essential (primary) hypertension: Secondary | ICD-10-CM | POA: Diagnosis not present

## 2023-11-12 DIAGNOSIS — F411 Generalized anxiety disorder: Secondary | ICD-10-CM

## 2023-11-12 MED ORDER — AMLODIPINE BESYLATE 5 MG PO TABS: 5.0000 mg | ORAL_TABLET | Freq: Every day | ORAL | 1 refills | Status: DC

## 2023-11-12 MED ORDER — SERTRALINE HCL 100 MG PO TABS: 100.0000 mg | ORAL_TABLET | Freq: Every day | ORAL | 5 refills | Status: DC

## 2023-11-12 NOTE — Progress Notes (Signed)
 Established Patient Office Visit  Subjective   Patient ID: Corey Mayo, male    DOB: 1970/12/03  Age: 53 y.o. MRN: [id number]  Chief Complaint  Patient presents with   Medical Management of Chronic Issues    3 month fup on Hypothyroidism, Rx refills    HPI  Hypertension Medication compliance: Taking amlodipine  5 mg daily as prescribed.  Denies chest pain, shortness of breath, lower extremity edema, vision changes, headaches.  Pertinent lab work: repeat BMP today Monitoring at home: does not monitor  Tolerating medication well: no side effects  Continue current medication regimen: no changes Follow-up: 6 months   Hypothyroid:  Medication compliance: Taking levothyroxine  112 mcg daily as prescribed  Taking medication on empty stomach 30-  60 minutes before breakfast:  Denies fatigue, cold intolerance, constipation, weight gain, memory issues, periorbital edema, dry skin:  Pertinent lab results: TSH 2.090, T3 2.6, Free T4 1.21 Current medication regimen: no change until labs are back Well-controlled: check labs today Follow-up in 6 weeks if dose changed: will adjust if needed after labs today  Follow-up in 6 months for medication management.   GAD: Taking sertraline  100 mg daily as prescribed.  Recent move unexpectedly which has caused stress. Lives 45 minutes away to this office now.    ROS    Objective:     BP 131/75   Pulse 71   Temp 98.6 F (37 C) (Oral)   Ht 6\' 5"  (1.956 m)   Wt (!) 334 lb 4.8 oz (151.6 kg)   SpO2 97%   BMI 39.64 kg/m  BP Readings from Last 3 Encounters:  11/12/23 131/75  08/18/23 126/76  06/11/23 137/79      Physical Exam Vitals and nursing note reviewed.  Constitutional:      General: He is not in acute distress.    Appearance: Normal appearance.  Cardiovascular:     Heart sounds: Normal heart sounds.  Pulmonary:     Breath sounds: Normal breath sounds.  Skin:    General: Skin is warm and dry.  Neurological:      General: No focal deficit present.     Mental Status: He is alert. Mental status is at baseline.  Psychiatric:        Mood and Affect: Mood normal.        Behavior: Behavior normal.        Thought Content: Thought content normal.        Judgment: Judgment normal.       11/12/2023    2:27 PM 06/11/2023   10:34 AM 01/26/2023    9:54 AM 02/10/2017    9:52 AM  GAD 7 : Generalized Anxiety Score  Nervous, Anxious, on Edge 2 2 0 1  Control/stop worrying 2 2 0 0  Worry too much - different things 2 0 1 0  Trouble relaxing 2 1 0 0  Restless 0 0 0 0  Easily annoyed or irritable 0 0 0 1  Afraid - awful might happen 0 2 0 0  Total GAD 7 Score 8 7 1 2   Anxiety Difficulty  Not difficult at all Not difficult at all Not difficult at all      No results found for any visits on 11/12/23.  Last metabolic panel Lab Results  Component Value Date   GLUCOSE 89 02/23/2023   NA 143 02/23/2023   K 4.7 02/23/2023   CL 107 (H) 02/23/2023   CO2 21 02/23/2023   BUN 9 02/23/2023  CREATININE 0.79 02/23/2023   EGFR 109 02/23/2023   CALCIUM 8.7 02/23/2023   PROT 6.6 02/23/2023   ALBUMIN 4.2 02/23/2023   LABGLOB 2.4 02/23/2023   BILITOT 0.3 02/23/2023   ALKPHOS 87 02/23/2023   AST 18 02/23/2023   ALT 22 02/23/2023   Last hemoglobin A1c Lab Results  Component Value Date   HGBA1C 5.4 03/10/2023   Last thyroid  functions Lab Results  Component Value Date   TSH 2.090 08/18/2023      The 10-year ASCVD risk score (Arnett DK, et al., 2019) is: 8.9%    Assessment & Plan:   Problem List Items Addressed This Visit     GAD (generalized anxiety disorder) - Primary   Taking sertraline  100 mg  daily as prescribed.  GAD-7 slightly increased due to unexpected move recently. He is adjusting to the new house. Symptoms are usually well controlled.  Follow-up in 6 months.         Relevant Medications   sertraline  (ZOLOFT ) 100 MG tablet   Essential hypertension    Taking amlodipine  5 mg daily  as prescribed.  Denies chest pain, shortness of breath, lower extremity edema, vision changes, headaches.  Pertinent lab work: repeat BMP today Well controlled in office. Continue current regimen. DASH diet and increase walking. Follow-up in 6 months.      Relevant Medications   amLODipine  (NORVASC ) 5 MG tablet   Other Relevant Orders   Basic metabolic panel with GFR   Hypothyroidism   Taking levothyroxine  112 mcg daily as prescribed  Taking medication on empty stomach 30-  60 minutes before breakfast:  Denies fatigue, cold intolerance, constipation, weight gain, memory issues, periorbital edema, dry skin:  Pertinent lab results: TSH 2.090, T3 2.6, Free T4 1.21 Recheck thyroid  function today. Will adjust medications if needed. Follow-up depending on labs today.       Relevant Orders   TSH+T4F+T3Free  Agrees with plan of care discussed.  Questions answered.   Return in about 6 months (around 05/13/2024) for HTN, hypothryoid .    Mickiel Albany, FNP

## 2023-11-12 NOTE — Assessment & Plan Note (Addendum)
 Taking sertraline  100 mg  daily as prescribed.  GAD-7 slightly increased due to unexpected move recently. He is adjusting to the new house. Symptoms are usually well controlled.  Follow-up in 6 months.

## 2023-11-12 NOTE — Assessment & Plan Note (Signed)
 Taking amlodipine  5 mg daily as prescribed.  Denies chest pain, shortness of breath, lower extremity edema, vision changes, headaches.  Pertinent lab work: repeat BMP today Well controlled in office. Continue current regimen. DASH diet and increase walking. Follow-up in 6 months.

## 2023-11-12 NOTE — Assessment & Plan Note (Signed)
 Taking levothyroxine  112 mcg daily as prescribed  Taking medication on empty stomach 30-  60 minutes before breakfast:  Denies fatigue, cold intolerance, constipation, weight gain, memory issues, periorbital edema, dry skin:  Pertinent lab results: TSH 2.090, T3 2.6, Free T4 1.21 Recheck thyroid  function today. Will adjust medications if needed. Follow-up depending on labs today.

## 2023-11-13 ENCOUNTER — Encounter: Payer: Self-pay | Admitting: Family Medicine

## 2023-11-13 ENCOUNTER — Other Ambulatory Visit: Payer: Self-pay | Admitting: Family Medicine

## 2023-11-13 DIAGNOSIS — E039 Hypothyroidism, unspecified: Secondary | ICD-10-CM

## 2023-11-13 LAB — BASIC METABOLIC PANEL WITH GFR
BUN/Creatinine Ratio: 16 (ref 9–20)
BUN: 12 mg/dL (ref 6–24)
CO2: 22 mmol/L (ref 20–29)
Calcium: 9.1 mg/dL (ref 8.7–10.2)
Chloride: 104 mmol/L (ref 96–106)
Creatinine, Ser: 0.75 mg/dL — ABNORMAL LOW (ref 0.76–1.27)
Glucose: 73 mg/dL (ref 70–99)
Potassium: 4.8 mmol/L (ref 3.5–5.2)
Sodium: 141 mmol/L (ref 134–144)
eGFR: 110 mL/min/{1.73_m2} (ref >59)

## 2023-11-13 LAB — TSH+T4F+T3FREE
Free T4: 1.2 ng/dL (ref 0.82–1.77)
T3, Free: 2.5 pg/mL (ref 2.0–4.4)
TSH: 2.69 u[IU]/mL (ref 0.450–4.500)

## 2023-11-13 MED ORDER — LEVOTHYROXINE SODIUM 112 MCG PO TABS
112.0000 ug | ORAL_TABLET | Freq: Every day | ORAL | 1 refills | Status: DC
Start: 2023-11-13 — End: 2024-05-22

## 2024-03-28 ENCOUNTER — Encounter: Payer: Self-pay | Admitting: Family Medicine

## 2024-05-12 ENCOUNTER — Ambulatory Visit: Admitting: Family Medicine

## 2024-05-22 ENCOUNTER — Other Ambulatory Visit: Payer: Self-pay | Admitting: Family Medicine

## 2024-05-22 DIAGNOSIS — I10 Essential (primary) hypertension: Secondary | ICD-10-CM

## 2024-05-22 DIAGNOSIS — E039 Hypothyroidism, unspecified: Secondary | ICD-10-CM

## 2024-05-27 ENCOUNTER — Encounter: Payer: Self-pay | Admitting: Family Medicine

## 2024-05-27 ENCOUNTER — Ambulatory Visit (INDEPENDENT_AMBULATORY_CARE_PROVIDER_SITE_OTHER): Admitting: Family Medicine

## 2024-05-27 VITALS — BP 132/82 | HR 67 | Temp 97.8°F | Ht 77.0 in | Wt 355.0 lb

## 2024-05-27 DIAGNOSIS — I10 Essential (primary) hypertension: Secondary | ICD-10-CM | POA: Diagnosis not present

## 2024-05-27 DIAGNOSIS — F411 Generalized anxiety disorder: Secondary | ICD-10-CM | POA: Diagnosis not present

## 2024-05-27 DIAGNOSIS — H6991 Unspecified Eustachian tube disorder, right ear: Secondary | ICD-10-CM | POA: Diagnosis not present

## 2024-05-27 DIAGNOSIS — E039 Hypothyroidism, unspecified: Secondary | ICD-10-CM

## 2024-05-27 MED ORDER — AMLODIPINE BESYLATE 5 MG PO TABS: 5.0000 mg | ORAL_TABLET | Freq: Every day | ORAL | 11 refills | Status: AC

## 2024-05-27 MED ORDER — LEVOTHYROXINE SODIUM 112 MCG PO TABS: 112.0000 ug | ORAL_TABLET | Freq: Every day | ORAL | 11 refills | Status: AC

## 2024-05-27 MED ORDER — SERTRALINE HCL 100 MG PO TABS: 100.0000 mg | ORAL_TABLET | Freq: Every day | ORAL | 11 refills | Status: AC

## 2024-05-27 MED ORDER — FLUTICASONE PROPIONATE 50 MCG/ACT NA SUSP: 2.0000 | Freq: Every day | NASAL | 6 refills | Status: AC

## 2024-05-27 NOTE — Progress Notes (Signed)
 Established Patient Office Visit  Subjective   Patient ID: Corey Mayo, male    DOB: 1970-11-27  Age: 53 y.o. MRN: [id number]  Chief Complaint  Patient presents with   Hypertension    6 month follow up   Hypothyroidism    HPI  HTN: amlodipine  5 mg daily Does not monitor blood pressure at home.  Denies chest pain or shortness of breath. Well controlled upon recheck.  Working on diet. BMP  Refills sent.   Hypothyroid: levothyroxine  112 mcg daily Denies constipation or dry skin.  TSH, Free T4 today Refills sent.   GAD: sertraline  100 mg daily Symptoms are well controlled. Has increased stress recently.  GAD-7 Score: 2  Refills sent.   Ears are clogged a lot. Right more than left. TMs normal. No cerumen build-up. Cetirizine 10 mg daily Add Flonase nasal spray     ROS    Objective:     BP 132/82 (Patient Position: Sitting, Cuff Size: Large)   Pulse 67   Temp 97.8 F (36.6 C) (Oral)   Ht 6' 5 (1.956 m)   Wt (!) 355 lb (161 kg)   SpO2 97%   BMI 42.10 kg/m  BP Readings from Last 3 Encounters:  05/27/24 132/82  11/12/23 131/75  08/18/23 126/76      Physical Exam Vitals and nursing note reviewed.  Constitutional:      General: He is not in acute distress.    Appearance: Normal appearance.  HENT:     Right Ear: Tympanic membrane normal.     Left Ear: Tympanic membrane normal.  Cardiovascular:     Rate and Rhythm: Regular rhythm.     Heart sounds: Normal heart sounds.  Pulmonary:     Effort: Pulmonary effort is normal.     Breath sounds: Normal breath sounds.  Skin:    General: Skin is warm and dry.  Neurological:     General: No focal deficit present.     Mental Status: He is alert. Mental status is at baseline.  Psychiatric:        Mood and Affect: Mood normal.        Behavior: Behavior normal.        Thought Content: Thought content normal.        Judgment: Judgment normal.      No results found for any visits on  05/27/24.  Last metabolic panel Lab Results  Component Value Date   GLUCOSE 73 11/12/2023   NA 141 11/12/2023   K 4.8 11/12/2023   CL 104 11/12/2023   CO2 22 11/12/2023   BUN 12 11/12/2023   CREATININE 0.75 (L) 11/12/2023   EGFR 110 11/12/2023   CALCIUM 9.1 11/12/2023   PROT 6.6 02/23/2023   ALBUMIN 4.2 02/23/2023   LABGLOB 2.4 02/23/2023   BILITOT 0.3 02/23/2023   ALKPHOS 87 02/23/2023   AST 18 02/23/2023   ALT 22 02/23/2023   Last thyroid  functions Lab Results  Component Value Date   TSH 2.690 11/12/2023   FREET4 1.20 11/12/2023      The 10-year ASCVD risk score (Arnett DK, et al., 2019) is: 9%    Assessment & Plan:   Problem List Items Addressed This Visit     GAD (generalized anxiety disorder)   Relevant Medications   sertraline  (ZOLOFT ) 100 MG tablet   Essential hypertension   Relevant Medications   amLODipine  (NORVASC ) 5 MG tablet   Other Relevant Orders   Basic metabolic panel with GFR  Hypothyroidism - Primary   Relevant Medications   levothyroxine  (SYNTHROID ) 112 MCG tablet   Other Relevant Orders   TSH + free T4   Eustachian tube dysfunction, right   Relevant Medications   fluticasone (FLONASE) 50 MCG/ACT nasal spray  Agrees with plan of care discussed.  Questions answered.   Return in about 6 weeks (around 07/11/2024) for CPE with labs.    Darice JONELLE Brownie, FNP

## 2024-05-28 LAB — BASIC METABOLIC PANEL WITH GFR
BUN/Creatinine Ratio: 16 (ref 9–20)
BUN: 12 mg/dL (ref 6–24)
CO2: 21 mmol/L (ref 20–29)
Calcium: 9.2 mg/dL (ref 8.7–10.2)
Chloride: 105 mmol/L (ref 96–106)
Creatinine, Ser: 0.76 mg/dL (ref 0.76–1.27)
Glucose: 87 mg/dL (ref 70–99)
Potassium: 4.6 mmol/L (ref 3.5–5.2)
Sodium: 141 mmol/L (ref 134–144)
eGFR: 109 mL/min/1.73 (ref >59)

## 2024-05-28 LAB — TSH+FREE T4
Free T4: 1.18 ng/dL (ref 0.82–1.77)
TSH: 4.99 u[IU]/mL — ABNORMAL HIGH (ref 0.450–4.500)

## 2024-05-29 ENCOUNTER — Ambulatory Visit: Payer: Self-pay | Admitting: Family Medicine

## 2024-07-12 ENCOUNTER — Encounter: Admitting: Family Medicine

## 2024-07-12 DIAGNOSIS — Z0000 Encounter for general adult medical examination without abnormal findings: Secondary | ICD-10-CM | POA: Insufficient documentation

## 2024-07-12 DIAGNOSIS — Z130 Encounter for screening for diseases of the blood and blood-forming organs and certain disorders involving the immune mechanism: Secondary | ICD-10-CM | POA: Insufficient documentation

## 2024-07-12 DIAGNOSIS — Z13220 Encounter for screening for lipoid disorders: Secondary | ICD-10-CM | POA: Insufficient documentation

## 2024-07-20 ENCOUNTER — Encounter: Admitting: Family Medicine
